# Patient Record
Sex: Female | Born: 1950 | Race: White | Hispanic: No | Marital: Married | Smoking: Former smoker
Health system: Southern US, Community
[De-identification: ages and names within clinical notes are randomized; demographics above are authoritative.]

## PROBLEM LIST (undated history)

## (undated) DIAGNOSIS — C4491 Basal cell carcinoma of skin, unspecified: Secondary | ICD-10-CM

## (undated) DIAGNOSIS — G43909 Migraine, unspecified, not intractable, without status migrainosus: Secondary | ICD-10-CM

## (undated) DIAGNOSIS — G4730 Sleep apnea, unspecified: Secondary | ICD-10-CM

## (undated) DIAGNOSIS — R112 Nausea with vomiting, unspecified: Secondary | ICD-10-CM

## (undated) DIAGNOSIS — M329 Systemic lupus erythematosus, unspecified: Secondary | ICD-10-CM

## (undated) DIAGNOSIS — Z9889 Other specified postprocedural states: Secondary | ICD-10-CM

## (undated) DIAGNOSIS — E119 Type 2 diabetes mellitus without complications: Secondary | ICD-10-CM

## (undated) DIAGNOSIS — F419 Anxiety disorder, unspecified: Secondary | ICD-10-CM

## (undated) DIAGNOSIS — T8859XA Other complications of anesthesia, initial encounter: Secondary | ICD-10-CM

## (undated) DIAGNOSIS — K589 Irritable bowel syndrome without diarrhea: Secondary | ICD-10-CM

## (undated) DIAGNOSIS — M069 Rheumatoid arthritis, unspecified: Secondary | ICD-10-CM

## (undated) DIAGNOSIS — G629 Polyneuropathy, unspecified: Secondary | ICD-10-CM

## (undated) DIAGNOSIS — I10 Essential (primary) hypertension: Secondary | ICD-10-CM

## (undated) DIAGNOSIS — IMO0002 Reserved for concepts with insufficient information to code with codable children: Secondary | ICD-10-CM

## (undated) DIAGNOSIS — I671 Cerebral aneurysm, nonruptured: Secondary | ICD-10-CM

## (undated) HISTORY — DX: Systemic lupus erythematosus, unspecified: M32.9

## (undated) HISTORY — DX: Irritable bowel syndrome, unspecified: K58.9

## (undated) HISTORY — PX: BIOPSY BREAST: PRO8

## (undated) HISTORY — PX: TENDON EXPLORATION: SHX5112

## (undated) HISTORY — DX: Migraine, unspecified, not intractable, without status migrainosus: G43.909

## (undated) HISTORY — DX: Cerebral aneurysm, nonruptured: I67.1

## (undated) HISTORY — PX: GALLBLADDER SURGERY: SHX652

## (undated) HISTORY — DX: Type 2 diabetes mellitus without complications: E11.9

## (undated) HISTORY — DX: Polyneuropathy, unspecified: G62.9

## (undated) HISTORY — DX: Basal cell carcinoma of skin, unspecified: C44.91

## (undated) HISTORY — PX: BLADDER SUSPENSION: SHX72

## (undated) HISTORY — DX: Reserved for concepts with insufficient information to code with codable children: IMO0002

---

## 2001-05-26 ENCOUNTER — Ambulatory Visit (HOSPITAL_BASED_OUTPATIENT_CLINIC_OR_DEPARTMENT_OTHER): Admission: RE | Admit: 2001-05-26 | Discharge: 2001-05-26 | Payer: Self-pay | Admitting: Family Medicine

## 2005-04-10 ENCOUNTER — Inpatient Hospital Stay (HOSPITAL_COMMUNITY): Admission: RE | Admit: 2005-04-10 | Discharge: 2005-04-13 | Payer: Self-pay | Admitting: Urology

## 2007-12-12 ENCOUNTER — Encounter: Admission: RE | Admit: 2007-12-12 | Discharge: 2007-12-12 | Payer: Self-pay | Admitting: Otolaryngology

## 2008-06-15 ENCOUNTER — Encounter: Admission: RE | Admit: 2008-06-15 | Discharge: 2008-06-15 | Payer: Self-pay | Admitting: Otolaryngology

## 2008-10-14 DIAGNOSIS — C4491 Basal cell carcinoma of skin, unspecified: Secondary | ICD-10-CM

## 2008-10-14 HISTORY — DX: Basal cell carcinoma of skin, unspecified: C44.91

## 2009-12-09 ENCOUNTER — Ambulatory Visit (HOSPITAL_BASED_OUTPATIENT_CLINIC_OR_DEPARTMENT_OTHER): Admission: RE | Admit: 2009-12-09 | Discharge: 2009-12-10 | Payer: Self-pay | Admitting: Urology

## 2010-07-04 ENCOUNTER — Encounter: Payer: Self-pay | Admitting: Otolaryngology

## 2010-08-28 LAB — GLUCOSE, CAPILLARY: Glucose-Capillary: 244 mg/dL — ABNORMAL HIGH (ref 70–99)

## 2010-08-28 LAB — PLATELET COUNT: Platelets: 168 10*3/uL (ref 150–400)

## 2010-08-28 LAB — POCT I-STAT 4, (NA,K, GLUC, HGB,HCT)
Glucose, Bld: 123 mg/dL — ABNORMAL HIGH (ref 70–99)
HCT: 37 % (ref 36.0–46.0)
Hemoglobin: 12.6 g/dL (ref 12.0–15.0)
Potassium: 3.9 mEq/L (ref 3.5–5.1)
Sodium: 141 mEq/L (ref 135–145)

## 2010-10-28 NOTE — Op Note (Signed)
[name], [name]                ACCOUNT NO.:  [id number]   MEDICAL RECORD NO.:  [id number]          PATIENT TYPE:  AMB   LOCATION:  DAY                          FACILITY:  [facility]   PHYSICIAN:  Sigmund I. Patsi Sears, M.D.DATE OF BIRTH:  August 10, 1950   DATE OF PROCEDURE:  04/10/2005  DATE OF DISCHARGE:                                 OPERATIVE REPORT   PREOPERATIVE DIAGNOSIS:  Pelvic prolapse with urinary incontinence.   POSTOPERATIVE DIAGNOSIS:  Pelvic prolapse with urinary incontinence.   OPERATION:  Pelvic prolapse repair with __________  bovine fascia and apical  suturing; anterior vaginal vault repair, Linx pubovaginal sling (mid  urethral), cystourethroscopy.   SURGEON:  Sigmund I. Patsi Sears, M.D.   ANESTHESIA:  General LMA.   PREOPERATIVE PREPARATION:  After appropriate preanesthesia, the patient was  brought to the operating room, placed on the operating room in the dorsal  supine position where general LMA anesthesia was induced. She was then  replaced in dorsal lithotomy position where pubis was prepped with Betadine  solution and draped in usual fashion.   REVIEW OF HISTORY:  This 60 year old female is status post hysterectomy in  the past, complains of stress urinary incontinence, and bladder pressure,  and urinary frequency. She has a history of antibiotic-induced colitis, and  SLE with migratory polyarthralgias, and a sed rate of 76. She has a history  of low platelets, currently has a platelet count of 96,000. She is now for  pelvic prolapse repair and incontinence repair.   PROCEDURE:  The large grade III cystocele was identified, and Foley catheter  was inserted in the  bladder. Ten cc of Marcaine 0.25 plain was injected in  the pubovaginal cervical arch tissue. A midline incision was then made  through a point proximal to the bladder neck, to the area of hysterectomy  incision. Subcutaneous tissue was sharply and bluntly dissected, all the way  to the pelvic  sidewall. This was accomplished with direct vision. Minimal  bleeding was noted during this dissection.   Using the Capio device, three separate #1 absorbable sutures were then  placed in the pelvic sidewall, and then a 4 x 7 centimeter portion of  __________  bovine fascia was then placed horizontally. This tissue fit  perfectly without any manipulation, and therefore each of the sutures was  placed through the fascia and the fascia was then sutured in place. The  cystocele repair was accomplished using 2-0 Vicryl suture, in classic Kelly  plication fashion, with care to not over extend the plication.   The fascia was then brought across the midline, and three separate Capio  sutures were then placed in the pelvic sidewall on the right side. The  sutures were then placed through the bovine fascia and sutured in place.  This gave excellent relief of the large central defect. A lateral defect was  identified, and this also repaired of the left lateral defect.   The edge of the vaginal tissue was trimmed approximately 2-4 mm on each  side, and then the edges were closed with a running the 2-0 Vicryl suture.   Cystoscopy  was accomplished after blue dye was given, and blue dye was  identified from the left side did not from the right side at this point.  Additional blue dye was given, and eventual repeat cystoscopy showed blue  dye from both orifices.   The mid urethra was identified, and a 0.25 plain Marcaine was injected in  the area of the mid urethra. An incision was made. Subcutaneous tissue was  then dissected bilaterally to either side of the urethra to the level of the  endopelvic fascia.   Suprapubically, midline was identified, and one fingerbreadth above the  pubis, two separate stab wounds were then made, subcutaneous tissue is  dissected with the Linx suprapubic dissector. This was passed  retropubically, brought out through the vaginal incision. Cystoscopy  revealed that  the left Linx probe was in the bladder.  This was removed,  replaced, repeat cystoscopy showed excellent placement with no bladder  trauma.   Foley catheter was replaced, and the [employer] Linx retropubic  suspension was then accomplished in the mid urethra. A large right-angle  clamp was used to tension the sling, and the plastic wrapper was removed.  After correct tensioning, the wings were then cut subcutaneously, and  following this, the urethral incision was closed in single layer with 2-0  Vicryl running suture. Skin was closed with 3-0 Vicryl suture, as well as  skin tapes. Foley catheter was left in place. Because of the patient's  history of low platelets, she was not given Toradol. Rather, she was  awakened and taken to the recovery room in good condition.      Sigmund I. Patsi Sears, M.D.  Electronically Signed     SIT/MEDQ  D:  04/10/2005  T:  04/10/2005  Job:  [id number]   cc:   Ruthy Dick  Fax: [phone]

## 2010-10-28 NOTE — Discharge Summary (Signed)
[name], [name]                ACCOUNT NO.:  [id number]   MEDICAL RECORD NO.:  [id number]          PATIENT TYPE:  INP   LOCATION:  1409                         FACILITY:  [facility]   PHYSICIAN:  Sigmund I. Patsi Sears, M.D.DATE OF BIRTH:  Mar 07, 1951   DATE OF ADMISSION:  04/10/2005  DATE OF DISCHARGE:  04/13/2005                                 DISCHARGE SUMMARY   FINAL DIAGNOSIS:  Pelvic prolapse.   OPERATION:  Took place on April 10, 2005.  The operation was  pelvicprolapse repair, cystocele repair, Lynxpubovaginal sling.   The patient's history is as follows.  [name] is a 60 year old married  female, seen on March 08, 2005 with suprapubic pressure, urinary  frequency, stress urinary incontinence.  On examination, she was found to  have a grade 3 cystourethrocele, and possible enterocele.  She had a  positive Marshall test, a positive Q-tip test in thesupine position.  She is  a para 2-2-0, status post hysterectomy in 2002.   PAST HISTORY:  1.  SLE with a sed rate of 76 (Dr. Kellie Simmering.)  2.  Antibiotic induced colitis.  3.  Migratory polyarthralgias.  4.  Liver hemangioma measuring 7.5 cm.  5.  History of low platelets (Platelets of 196,000 on day of surgery.)   PAST SURGERIES:  1.  Hysterectomy in 2002.  2.  Finger tendon surgery in 1999.  3.  Breast lumpectomy in 1978.  4.  Cholecystectomy.   TOBACCO:  A 30-pack year history, but none since 1992.   ALCOHOL:  Occasionally.   ALLERGIES:  KEFLEX.   MEDICATIONS:  1.  Wellbutrin 150 mg one p.o. per day for stress and anxiety.  2.  Lisinopril/Hydrochlorothiazide 20/25 mg one p.o. per day for      hypertension.  3.  Estrasorb.  4.  Tylenol.  5.  Ibuprofen.   FAMILY HISTORY:  Father died secondary to chronic lung disease.  Mother died  secondary to a cerebral hemorrhage at age 37.   REVIEW OF SYSTEMS:  Positive for headache, tired, sluggishness, abdominal  discomfort, occasional diarrhea, skin rash,  dyspareunia secondary to  dropped bladder, and urinary frequency.  The patient also complains of  cough/sneezing incontinence (See HPI,) and straining to have bowel movements  because something in the way.   PHYSICAL EXAMINATION:  As noted in dictated H&P of April 10, 2005.   ADMISSION LABORATORY DATA:  A platelet count of 191,000.  The admission EKG  is normal.  Hemoglobin is 10, hematocrit 32, potassium 3.5.   HOSPITAL COURSE:  On the day of admission, the patient underwent pelvic  floor repair and a Lynxrepair.  Lynx transvaginal sling with Dr. Patsi Sears  and Dr. McDiarmid.  The patient tolerated the procedure well.  She was  admitted to the hospital postoperatively for evaluation.  She had her  packing removed on the first postoperative day, and was allowed to be  discharged with the Foley catheter out on postop day two voiding independent  of catheter.  She tolerated her hospitalization well.  She was allowed to be  discharged on antibiotics, and pain medication.  She  will return to the  office for followup.  Appointment is made.      Sigmund I. Patsi Sears, M.D.  Electronically Signed     SIT/MEDQ  D:  06/29/2005  T:  06/29/2005  Job:  [id number]

## 2010-10-28 NOTE — H&P (Signed)
[name], [name]                ACCOUNT NO.:  [id number]   MEDICAL RECORD NO.:  [id number]          PATIENT TYPE:  AMB   LOCATION:  DAY                          [facility]ILITY:  [facility]   PHYSICIAN:  Sigmund I. Patsi Sears, M.D.DATE OF BIRTH:  1950-09-22   DATE OF ADMISSION:  04/10/2005  DATE OF DISCHARGE:                                HISTORY & PHYSICAL   HISTORY:  [name] is a 60 year old married female, seen 03/08/05,  complaining of the suprapubic bladder pressure, urinary frequency, stress  urinary continence. The patient was found on examination to have very grade  3 cystocele, possible enterocele as well. She had positive Marshall test,  positive Q-tip test in the supine position. She is para 2-2-0, and status  post hysterectomy in 2002.  Note, her past history is significant for  systemic lupus erythematosus, with a sed rate of 76 (Dr. Kellie Simmering), history  of antibiotic induced colitis, migratory polyarthralgias, liver hemangioma  measuring 7.5 cm, and a history of low platelets (currently 96,000, the day  of surgery was 191,000).   PAST SURGERIES:  1.  Hysterectomy 2002.  2.  Finger tendon surgery, 1999.  3.  Breast lumpectomy 1978.  4.  Cholecystectomy.   Gyn history: The patient is 2-2-0, and is status post hysterectomy.   Tobacco: 30 pack history but none since 1992.   Alcohol:  Occasional only.   ALLERGIES:  KEFLEX.   MEDICATIONS:  1.  Wellbutrin 150 milligrams one p.o. per day for stress and anxiety.  2.  Lisinopril/hydrochlorothiazide 20/25 milligrams one by mouth per day for      hypertension.  3.  Estrasorb.  4.  Tylenol.  5.  Ibuprofen.   FAMILY HISTORY:  Father deceased age 3 secondary to chronic lung disease,  and mother deceased at age 4 secondary to cerebral hemorrhage.   REVIEW OF SYSTEMS:  1.  Headache.  2.  Tiredness.  3.  Sluggishness.  4.  Abdominal discomfort.  5.  Occasional diarrhea.  6.  Skin rash.  7.  Painful intercourse or a dropped  bladder.  8.  Urinary frequency.  9.  Cough and sneeze incontinence.  10. Strain to have bowel movements because something in the way..   PHYSICAL EXAMINATION:  VITALS:  Her initial physical examination today shows  a well-developed white female in no acute distress. Her temperature is 96.7,  heart rate 99, respiratory rate 20, blood pressure 160/94.  HEENT: PERL.  EOM full.  NECK:  Supple, nontender, no nodes.  CHEST: Clear to P&A.  ABDOMEN:  Soft. Positive bowel sounds without organomegaly, without masses.  GENITOURINARY: The patient has normal external female genitalia. The  urethral meatus was normal in size and location.  There no masses. The  vagina has no discharge. The bladder is palpable, and [name] test was  positive in the supine position with a positive Q-tip test. There was a  urethrocele, grade 3 cystocele or possibly enterocele as well.  RECTAL:  Examination shows normal sphincter tone, no masses.  LYMPHATICS:  Nonpalpable.  SKIN:  Normal to inspection and palpation.  PSYCHOLOGIC:  Showed normal  orientation to time, person and place.   ADMITTING IMPRESSION:  Patient with a pelvic prolapse, large central defect,  in need of pelvic prolapse repair, cystocele repair, and pubovaginal sling.      Sigmund I. Patsi Sears, M.D.  Electronically Signed     SIT/MEDQ  D:  04/10/2005  T:  04/10/2005  Job:  [id number]

## 2011-02-16 ENCOUNTER — Encounter (INDEPENDENT_AMBULATORY_CARE_PROVIDER_SITE_OTHER): Payer: BC Managed Care – PPO | Admitting: Ophthalmology

## 2011-02-16 DIAGNOSIS — H4420 Degenerative myopia, unspecified eye: Secondary | ICD-10-CM

## 2011-02-16 DIAGNOSIS — H43819 Vitreous degeneration, unspecified eye: Secondary | ICD-10-CM

## 2011-02-16 DIAGNOSIS — H3530 Unspecified macular degeneration: Secondary | ICD-10-CM

## 2011-02-16 DIAGNOSIS — H2510 Age-related nuclear cataract, unspecified eye: Secondary | ICD-10-CM

## 2011-08-16 ENCOUNTER — Ambulatory Visit (INDEPENDENT_AMBULATORY_CARE_PROVIDER_SITE_OTHER): Payer: BC Managed Care – PPO | Admitting: Ophthalmology

## 2011-08-16 DIAGNOSIS — H43819 Vitreous degeneration, unspecified eye: Secondary | ICD-10-CM

## 2011-08-16 DIAGNOSIS — H3530 Unspecified macular degeneration: Secondary | ICD-10-CM

## 2011-08-16 DIAGNOSIS — H2510 Age-related nuclear cataract, unspecified eye: Secondary | ICD-10-CM

## 2011-08-16 DIAGNOSIS — H4420 Degenerative myopia, unspecified eye: Secondary | ICD-10-CM

## 2012-02-23 ENCOUNTER — Ambulatory Visit (INDEPENDENT_AMBULATORY_CARE_PROVIDER_SITE_OTHER): Payer: BC Managed Care – PPO | Admitting: Ophthalmology

## 2012-03-01 ENCOUNTER — Ambulatory Visit (INDEPENDENT_AMBULATORY_CARE_PROVIDER_SITE_OTHER): Payer: BC Managed Care – PPO | Admitting: Ophthalmology

## 2012-03-01 DIAGNOSIS — H3530 Unspecified macular degeneration: Secondary | ICD-10-CM

## 2012-03-01 DIAGNOSIS — I10 Essential (primary) hypertension: Secondary | ICD-10-CM

## 2012-03-01 DIAGNOSIS — H4420 Degenerative myopia, unspecified eye: Secondary | ICD-10-CM

## 2012-03-01 DIAGNOSIS — H43819 Vitreous degeneration, unspecified eye: Secondary | ICD-10-CM

## 2012-03-01 DIAGNOSIS — H35039 Hypertensive retinopathy, unspecified eye: Secondary | ICD-10-CM

## 2012-08-29 ENCOUNTER — Ambulatory Visit (INDEPENDENT_AMBULATORY_CARE_PROVIDER_SITE_OTHER): Payer: BC Managed Care – PPO | Admitting: Ophthalmology

## 2012-08-29 DIAGNOSIS — H35039 Hypertensive retinopathy, unspecified eye: Secondary | ICD-10-CM

## 2012-08-29 DIAGNOSIS — H43819 Vitreous degeneration, unspecified eye: Secondary | ICD-10-CM

## 2012-08-29 DIAGNOSIS — E1139 Type 2 diabetes mellitus with other diabetic ophthalmic complication: Secondary | ICD-10-CM

## 2012-08-29 DIAGNOSIS — I10 Essential (primary) hypertension: Secondary | ICD-10-CM

## 2012-08-29 DIAGNOSIS — H4420 Degenerative myopia, unspecified eye: Secondary | ICD-10-CM

## 2012-08-29 DIAGNOSIS — E11319 Type 2 diabetes mellitus with unspecified diabetic retinopathy without macular edema: Secondary | ICD-10-CM

## 2012-11-11 ENCOUNTER — Encounter (INDEPENDENT_AMBULATORY_CARE_PROVIDER_SITE_OTHER): Payer: BC Managed Care – PPO | Admitting: Ophthalmology

## 2012-11-11 DIAGNOSIS — H43819 Vitreous degeneration, unspecified eye: Secondary | ICD-10-CM

## 2012-11-11 DIAGNOSIS — H4420 Degenerative myopia, unspecified eye: Secondary | ICD-10-CM

## 2012-11-11 DIAGNOSIS — E1139 Type 2 diabetes mellitus with other diabetic ophthalmic complication: Secondary | ICD-10-CM

## 2012-11-11 DIAGNOSIS — H35039 Hypertensive retinopathy, unspecified eye: Secondary | ICD-10-CM

## 2012-11-11 DIAGNOSIS — E11319 Type 2 diabetes mellitus with unspecified diabetic retinopathy without macular edema: Secondary | ICD-10-CM

## 2012-11-11 DIAGNOSIS — I10 Essential (primary) hypertension: Secondary | ICD-10-CM

## 2012-11-19 DIAGNOSIS — D229 Melanocytic nevi, unspecified: Secondary | ICD-10-CM

## 2012-11-19 HISTORY — DX: Melanocytic nevi, unspecified: D22.9

## 2013-06-02 ENCOUNTER — Ambulatory Visit (INDEPENDENT_AMBULATORY_CARE_PROVIDER_SITE_OTHER): Payer: BC Managed Care – PPO | Admitting: Ophthalmology

## 2013-08-12 ENCOUNTER — Ambulatory Visit (INDEPENDENT_AMBULATORY_CARE_PROVIDER_SITE_OTHER): Payer: BC Managed Care – PPO | Admitting: Ophthalmology

## 2013-08-12 DIAGNOSIS — H4420 Degenerative myopia, unspecified eye: Secondary | ICD-10-CM

## 2013-08-12 DIAGNOSIS — H43819 Vitreous degeneration, unspecified eye: Secondary | ICD-10-CM

## 2013-08-12 DIAGNOSIS — E11319 Type 2 diabetes mellitus with unspecified diabetic retinopathy without macular edema: Secondary | ICD-10-CM

## 2013-08-12 DIAGNOSIS — I10 Essential (primary) hypertension: Secondary | ICD-10-CM

## 2013-08-12 DIAGNOSIS — E1139 Type 2 diabetes mellitus with other diabetic ophthalmic complication: Secondary | ICD-10-CM

## 2013-08-12 DIAGNOSIS — E1165 Type 2 diabetes mellitus with hyperglycemia: Secondary | ICD-10-CM

## 2013-08-12 DIAGNOSIS — H35039 Hypertensive retinopathy, unspecified eye: Secondary | ICD-10-CM

## 2013-09-17 DIAGNOSIS — C4491 Basal cell carcinoma of skin, unspecified: Secondary | ICD-10-CM

## 2013-09-17 HISTORY — DX: Basal cell carcinoma of skin, unspecified: C44.91

## 2014-08-13 ENCOUNTER — Ambulatory Visit (INDEPENDENT_AMBULATORY_CARE_PROVIDER_SITE_OTHER): Payer: BLUE CROSS/BLUE SHIELD | Admitting: Ophthalmology

## 2014-08-13 DIAGNOSIS — E11329 Type 2 diabetes mellitus with mild nonproliferative diabetic retinopathy without macular edema: Secondary | ICD-10-CM | POA: Diagnosis not present

## 2014-08-13 DIAGNOSIS — I10 Essential (primary) hypertension: Secondary | ICD-10-CM | POA: Diagnosis not present

## 2014-08-13 DIAGNOSIS — H35033 Hypertensive retinopathy, bilateral: Secondary | ICD-10-CM

## 2014-08-13 DIAGNOSIS — H43813 Vitreous degeneration, bilateral: Secondary | ICD-10-CM | POA: Diagnosis not present

## 2014-08-13 DIAGNOSIS — E11319 Type 2 diabetes mellitus with unspecified diabetic retinopathy without macular edema: Secondary | ICD-10-CM | POA: Diagnosis not present

## 2014-08-13 DIAGNOSIS — H4423 Degenerative myopia, bilateral: Secondary | ICD-10-CM

## 2015-08-17 ENCOUNTER — Ambulatory Visit (INDEPENDENT_AMBULATORY_CARE_PROVIDER_SITE_OTHER): Payer: BLUE CROSS/BLUE SHIELD | Admitting: Ophthalmology

## 2015-08-17 DIAGNOSIS — H43813 Vitreous degeneration, bilateral: Secondary | ICD-10-CM

## 2015-08-17 DIAGNOSIS — I10 Essential (primary) hypertension: Secondary | ICD-10-CM

## 2015-08-17 DIAGNOSIS — H4423 Degenerative myopia, bilateral: Secondary | ICD-10-CM

## 2015-08-17 DIAGNOSIS — H35033 Hypertensive retinopathy, bilateral: Secondary | ICD-10-CM

## 2015-10-27 DIAGNOSIS — C4492 Squamous cell carcinoma of skin, unspecified: Secondary | ICD-10-CM

## 2015-10-27 HISTORY — DX: Squamous cell carcinoma of skin, unspecified: C44.92

## 2016-08-16 ENCOUNTER — Ambulatory Visit (INDEPENDENT_AMBULATORY_CARE_PROVIDER_SITE_OTHER): Payer: BLUE CROSS/BLUE SHIELD | Admitting: Ophthalmology

## 2016-08-16 DIAGNOSIS — H4423 Degenerative myopia, bilateral: Secondary | ICD-10-CM | POA: Diagnosis not present

## 2016-08-16 DIAGNOSIS — H35033 Hypertensive retinopathy, bilateral: Secondary | ICD-10-CM | POA: Diagnosis not present

## 2016-08-16 DIAGNOSIS — H43813 Vitreous degeneration, bilateral: Secondary | ICD-10-CM | POA: Diagnosis not present

## 2016-08-16 DIAGNOSIS — I10 Essential (primary) hypertension: Secondary | ICD-10-CM

## 2016-12-19 DIAGNOSIS — R51 Headache: Secondary | ICD-10-CM | POA: Diagnosis not present

## 2016-12-19 DIAGNOSIS — Z6828 Body mass index (BMI) 28.0-28.9, adult: Secondary | ICD-10-CM | POA: Diagnosis not present

## 2016-12-19 DIAGNOSIS — H532 Diplopia: Secondary | ICD-10-CM | POA: Diagnosis not present

## 2017-01-02 DIAGNOSIS — E1165 Type 2 diabetes mellitus with hyperglycemia: Secondary | ICD-10-CM | POA: Diagnosis not present

## 2017-01-02 DIAGNOSIS — K210 Gastro-esophageal reflux disease with esophagitis: Secondary | ICD-10-CM | POA: Diagnosis not present

## 2017-01-02 DIAGNOSIS — E559 Vitamin D deficiency, unspecified: Secondary | ICD-10-CM | POA: Diagnosis not present

## 2017-01-02 DIAGNOSIS — E781 Pure hyperglyceridemia: Secondary | ICD-10-CM | POA: Diagnosis not present

## 2017-01-02 DIAGNOSIS — I10 Essential (primary) hypertension: Secondary | ICD-10-CM | POA: Diagnosis not present

## 2017-01-02 DIAGNOSIS — E1143 Type 2 diabetes mellitus with diabetic autonomic (poly)neuropathy: Secondary | ICD-10-CM | POA: Diagnosis not present

## 2017-01-02 DIAGNOSIS — F320 Major depressive disorder, single episode, mild: Secondary | ICD-10-CM | POA: Diagnosis not present

## 2017-01-05 DIAGNOSIS — K580 Irritable bowel syndrome with diarrhea: Secondary | ICD-10-CM | POA: Diagnosis not present

## 2017-01-05 DIAGNOSIS — E559 Vitamin D deficiency, unspecified: Secondary | ICD-10-CM | POA: Diagnosis not present

## 2017-01-05 DIAGNOSIS — Z6827 Body mass index (BMI) 27.0-27.9, adult: Secondary | ICD-10-CM | POA: Diagnosis not present

## 2017-01-05 DIAGNOSIS — E1165 Type 2 diabetes mellitus with hyperglycemia: Secondary | ICD-10-CM | POA: Diagnosis not present

## 2017-01-05 DIAGNOSIS — I10 Essential (primary) hypertension: Secondary | ICD-10-CM | POA: Diagnosis not present

## 2017-01-05 DIAGNOSIS — M3210 Systemic lupus erythematosus, organ or system involvement unspecified: Secondary | ICD-10-CM | POA: Diagnosis not present

## 2017-01-05 DIAGNOSIS — E1143 Type 2 diabetes mellitus with diabetic autonomic (poly)neuropathy: Secondary | ICD-10-CM | POA: Diagnosis not present

## 2017-01-05 DIAGNOSIS — G619 Inflammatory polyneuropathy, unspecified: Secondary | ICD-10-CM | POA: Diagnosis not present

## 2017-01-17 DIAGNOSIS — Z23 Encounter for immunization: Secondary | ICD-10-CM | POA: Diagnosis not present

## 2017-01-24 ENCOUNTER — Ambulatory Visit (INDEPENDENT_AMBULATORY_CARE_PROVIDER_SITE_OTHER): Payer: Medicare Other | Admitting: Neurology

## 2017-01-24 ENCOUNTER — Encounter: Payer: Self-pay | Admitting: Neurology

## 2017-01-24 VITALS — BP 171/94 | HR 79 | Ht 66.0 in | Wt 176.0 lb

## 2017-01-24 DIAGNOSIS — R5382 Chronic fatigue, unspecified: Secondary | ICD-10-CM | POA: Diagnosis not present

## 2017-01-24 DIAGNOSIS — H532 Diplopia: Secondary | ICD-10-CM | POA: Diagnosis not present

## 2017-01-24 DIAGNOSIS — G4489 Other headache syndrome: Secondary | ICD-10-CM

## 2017-01-24 DIAGNOSIS — E538 Deficiency of other specified B group vitamins: Secondary | ICD-10-CM | POA: Diagnosis not present

## 2017-01-24 MED ORDER — TOPIRAMATE 25 MG PO TABS: ORAL_TABLET | ORAL | 3 refills | Status: DC

## 2017-01-24 NOTE — Progress Notes (Signed)
Reason for visit: Headache, double vision  Referring physician: Dr. Howie Ill is a 66 y.o. female  History of present illness:  [name] is a 66 year old right-handed white female with a history of diabetes and a history of lupus. The patient retired in June 2018, and around that time she began noting onset of headaches that were occurring 3 or 4 times a week. The headaches are in the frontal areas and on the top of the head, they may be associated with nausea without vomiting. She denies significant issues with photophobia or phonophobia. She did not have headaches earlier in her life, this is a new issue for her. The patient has also begun to note episodes of double vision that are occurring on average twice a week, may last for several hours and then clear. The double vision is primarily horizontal in nature, and does not always correlate with the headaches. The patient reports no other numbness or weakness on the face, arms, or legs. She does have some tingling in the toes that she attributes to a peripheral neuropathy associated with the diabetes. She has also had 3 events of hard shaking chills, possibly associated with fever. The patient has not had any weight loss. She does have a history of macular degeneration that is present on both sides, worse on the left. She notes that during the periods of double vision when she closes one eye the double vision will go away. The patient is taking Tylenol for the headache, this helped initially but now is ineffective, she is taking Excedrin Migraine at this time. She has not had a change in balance or a change in control of the bowels or the bladder. She is sent to this office for further evaluation. She denies any droopiness of the eyelids. The patient denies any difficulty with slurred speech or difficulty swallowing.  Past Medical History:  Diagnosis Date  . Basal cell carcinoma   . Diabetes ([place])   . IBS (irritable bowel  syndrome)   . Lupus   . Neuropathy     Past Surgical History:  Procedure Laterality Date  . BIOPSY BREAST Right    negative  . GALLBLADDER SURGERY    . TENDON EXPLORATION     arms/hand    Family History  Problem Relation Age of Onset  . Cerebral aneurysm Mother   . High blood pressure Mother   . Thyroid disease Mother   . COPD Father     Social history:  reports that she has quit smoking. She has never used smokeless tobacco. She reports that she does not drink alcohol or use drugs.  Medications:  Prior to Admission medications   Medication Sig Start Date End Date Taking? Authorizing Provider  amoxicillin (AMOXIL) 500 MG capsule TAKE THREE CAPSULES BY MOUTH TWICE DAILY 01/10/17  Yes [provider]  aspirin-acetaminophen-caffeine (EXCEDRIN MIGRAINE) 250-250-65 MG tablet Take 1 tablet by mouth every 6 (six) hours as needed for headache.   Yes [provider]  Cholecalciferol (VITAMIN D PO) Take 2,000 Units by mouth daily. With calcium   Yes [provider]  diphenhydramine-acetaminophen (TYLENOL PM) 25-500 MG TABS tablet Take 1 tablet by mouth at bedtime as needed.   Yes [provider]  FLUoxetine (PROZAC) 20 MG capsule Take 20 mg by mouth daily. 01/23/17  Yes [provider]  lisinopril-hydrochlorothiazide (PRINZIDE,ZESTORETIC) 10-12.5 MG tablet Take 1 tablet by mouth daily.   Yes [provider]  pioglitazone (ACTOS) 15 MG  tablet Take 15 mg by mouth daily. 01/05/17  Yes [provider]  [facility] injection  01/16/17  Yes [provider]  zolpidem (AMBIEN) 10 MG tablet Take 10 mg by mouth at bedtime as needed for sleep.   Yes [provider]     Not on File  ROS:  Out of a complete 14 system review of symptoms, the patient complains only of the following symptoms, and all other reviewed systems are negative.  Fevers, chills, fatigue Swelling in the legs Dizziness Blurred vision, double vision,  eye pain Snoring Diarrhea Feeling hot, cold, flushing Joint pain, achy muscles Confusion, headache, dizziness  Blood pressure (!) 171/94, pulse 79, height 5\' 6"  (1.676 m), weight 176 lb (79.8 kg).  Physical Exam  General: The patient is alert and cooperative at the time of the examination.  Eyes: Pupils are equal, round, and reactive to light. Discs are flat bilaterally.  Neck: The neck is supple, no carotid bruits are noted.  Respiratory: The respiratory examination is clear.  Cardiovascular: The cardiovascular examination reveals a regular rate and rhythm, no obvious murmurs or rubs are noted.  Skin: Extremities are without significant edema.  Neurologic Exam  Mental status: The patient is alert and oriented x 3 at the time of the examination. The patient has apparent normal recent and remote memory, with an apparently normal attention span and concentration ability.  Cranial nerves: Facial symmetry is present. There is good sensation of the face to pinprick and soft touch bilaterally. The strength of the facial muscles and the muscles to head turning and shoulder shrug are normal bilaterally. Speech is well enunciated, no aphasia or dysarthria is noted. Extraocular movements are full. Visual fields are full. The tongue is midline, and the patient has symmetric elevation of the soft palate. No obvious hearing deficits are noted.  Motor: The motor testing reveals 5 over 5 strength of all 4 extremities. Good symmetric motor tone is noted throughout.  Sensory: Sensory testing is intact to pinprick, soft touch, vibration sensation, and position sense on all 4 extremities, with exception of a stocking pattern pinprick sensory deficit in the distal one half of the lower extremity is bilaterally. No evidence of extinction is noted.  Coordination: Cerebellar testing reveals good finger-nose-finger and heel-to-shin bilaterally.  Gait and station: Gait is normal. Tandem gait is slightly  unsteady. Romberg is negative. No drift is seen.  Reflexes: Deep tendon reflexes are symmetric, but are somewhat brisk bilaterally. The patient has 4-5 beats of ankle clonus on the right, not present on the left. Toes are downgoing bilaterally.   Assessment/Plan:  1. Headache, new onset  2. Double vision, episodic  3. Diabetes  4. Lupus  The patient has had onset of headache, intermittent double vision, she has also had several episodes of shaking chills and fever. These events have occurred over the last 2 months. The patient will be sent for blood work today, she will have MRI of the brain. MRA of the head will be set up. She will be given a trial on Topamax for the headache, she will follow-up in 2 months. She will call for any dose adjustments of the medication.   Jill Alexanders MD 01/24/2017 12:04 PM  [facility] [address] [place], [place] [address]  Phone [phone] Fax [phone]

## 2017-01-24 NOTE — Patient Instructions (Addendum)
   We will check MRI of the brain and get blood work.   WE will start Topamax for the headache.  Topamax (topiramate) is a seizure medication that has an FDA approval for seizures and for migraine headache. Potential side effects of this medication include weight loss, cognitive slowing, tingling in the fingers and toes, and carbonated drinks will taste bad. If any significant side effects are noted on this drug, please contact our office.

## 2017-01-27 LAB — ENA+DNA/DS+SJORGEN'S
ENA RNP Ab: 6.8 AI — ABNORMAL HIGH (ref 0.0–0.9)
ENA SM Ab Ser-aCnc: <0.2 AI (ref 0.0–0.9)
ENA SSA (RO) Ab: >8 AI — ABNORMAL HIGH (ref 0.0–0.9)
dsDNA Ab: 9 IU/mL (ref 0–9)

## 2017-01-27 LAB — VITAMIN B12: Vitamin B-12: 241 pg/mL (ref 232–1245)

## 2017-01-27 LAB — RHEUMATOID FACTOR: Rheumatoid fact SerPl-aCnc: 16.4 IU/mL — ABNORMAL HIGH (ref 0.0–13.9)

## 2017-01-27 LAB — PAN-ANCA
ANCA Proteinase 3: <3.5 U/mL (ref 0.0–3.5)
Atypical pANCA: <1:20 {titer}
C-ANCA: <1:20 {titer}

## 2017-01-27 LAB — B. BURGDORFI ANTIBODIES: Lyme IgG/IgM Ab: <0.91 {ISR} (ref 0.00–0.90)

## 2017-01-27 LAB — ANA W/REFLEX: Anti Nuclear Antibody(ANA): POSITIVE — AB

## 2017-01-27 LAB — ANGIOTENSIN CONVERTING ENZYME: Angio Convert Enzyme: <15 U/L (ref 14–82)

## 2017-01-27 LAB — SEDIMENTATION RATE: Sed Rate: 46 mm/hr — ABNORMAL HIGH (ref 0–40)

## 2017-01-27 LAB — TSH: TSH: 0.916 u[IU]/mL (ref 0.450–4.500)

## 2017-01-27 LAB — ACETYLCHOLINE RECEPTOR, BINDING: AChR Binding Ab, Serum: 0.1 nmol/L (ref 0.00–0.24)

## 2017-01-28 ENCOUNTER — Telehealth: Payer: Self-pay | Admitting: Neurology

## 2017-01-28 NOTE — Telephone Encounter (Signed)
I called patient. The blood work reveals a positive rheumatoid factor in low titer, positive ANA with multiple positive antibodies on the panel, and a slightly elevated sedimentation rate. The patient does have lupus, this could be a potential source of some of her symptoms.  The vitamin B12 level was in the low normal range, she is to go on vitamin B-12 supplementation, taking 1000 g daily.  MRI the brain and MRA of the head is pending.

## 2017-01-28 NOTE — Telephone Encounter (Signed)
This patient needs Xanax for her MRI study.

## 2017-01-29 MED ORDER — ALPRAZOLAM 0.5 MG PO TABS: ORAL_TABLET | ORAL | 0 refills | Status: DC

## 2017-01-29 NOTE — Telephone Encounter (Signed)
Faxed printed/signed rx xanax to patient pharmacy at [phone]. Received confirmation.

## 2017-02-04 ENCOUNTER — Ambulatory Visit
Admission: RE | Admit: 2017-02-04 | Discharge: 2017-02-04 | Disposition: A | Discharge status: Home / Self Care | Payer: Medicare Other | Source: Ambulatory Visit | Attending: Neurology | Admitting: Neurology

## 2017-02-04 DIAGNOSIS — R51 Headache: Secondary | ICD-10-CM | POA: Diagnosis not present

## 2017-02-04 DIAGNOSIS — G4489 Other headache syndrome: Secondary | ICD-10-CM | POA: Diagnosis not present

## 2017-02-04 DIAGNOSIS — H532 Diplopia: Secondary | ICD-10-CM

## 2017-02-04 MED ORDER — GADOBENATE DIMEGLUMINE 529 MG/ML IV SOLN
15.0000 mL | Freq: Once | INTRAVENOUS | Status: AC | PRN
  Administered 2017-02-04: 15 mL via INTRAVENOUS

## 2017-02-06 ENCOUNTER — Telehealth: Payer: Self-pay | Admitting: Neurology

## 2017-02-06 DIAGNOSIS — M3219 Other organ or system involvement in systemic lupus erythematosus: Secondary | ICD-10-CM

## 2017-02-06 MED ORDER — PYRIDOSTIGMINE BROMIDE 60 MG PO TABS: 30.0000 mg | ORAL_TABLET | Freq: Three times a day (TID) | ORAL | 2 refills | Status: DC

## 2017-02-06 NOTE — Telephone Encounter (Signed)
I called patient. The Topamax has helped the headaches some, she is now 50 mg at night she will go to 75. She is still having double vision, this has worsened, she can close either eye and the double vision goes away.  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  I will give the patient an empiric trial on Mestinon taking 30 mg 3 times daily.  I will make a referral for a second opinion through a rheumatologist. 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   MRI brain 02/05/17:  IMPRESSION:  This MRI of the brain with and without contrast shows the following: 1.   Cortical atrophy, essentially unchanged compared to the 06/15/2008 MRI. 2.   A few scattered T2/FLAIR hyperintense foci consistent with mild age-related chronic microvascular ischemic change. None of these appear to be acute.   3.   Heterogenous appearance to the parotid glands is is nonspecific but could be due to Sjogren's syndrome or other autoimmune disorder.   This was also noted on the 2010 MRI.   MRA head 02/05/17:  IMPRESSION:  This MR angiogram of the intracranial arteries shows the following: 1.    Small 2.5 mm outpouching of the left supraclinoid internal carotid artery. This could represent a small aneurysm or an enlarged arterial infundibulum. 2.    Mild left vertebral artery stenosis that does not appear to be hemodynamically significant.

## 2017-02-20 DIAGNOSIS — Z961 Presence of intraocular lens: Secondary | ICD-10-CM | POA: Diagnosis not present

## 2017-02-22 DIAGNOSIS — R5383 Other fatigue: Secondary | ICD-10-CM | POA: Diagnosis not present

## 2017-02-22 DIAGNOSIS — R768 Other specified abnormal immunological findings in serum: Secondary | ICD-10-CM | POA: Diagnosis not present

## 2017-02-22 DIAGNOSIS — M2550 Pain in unspecified joint: Secondary | ICD-10-CM | POA: Diagnosis not present

## 2017-02-22 DIAGNOSIS — R51 Headache: Secondary | ICD-10-CM | POA: Diagnosis not present

## 2017-02-22 DIAGNOSIS — D696 Thrombocytopenia, unspecified: Secondary | ICD-10-CM | POA: Diagnosis not present

## 2017-02-22 DIAGNOSIS — E663 Overweight: Secondary | ICD-10-CM | POA: Diagnosis not present

## 2017-02-22 DIAGNOSIS — Z6827 Body mass index (BMI) 27.0-27.9, adult: Secondary | ICD-10-CM | POA: Diagnosis not present

## 2017-02-22 DIAGNOSIS — E119 Type 2 diabetes mellitus without complications: Secondary | ICD-10-CM | POA: Diagnosis not present

## 2017-02-22 DIAGNOSIS — Z8739 Personal history of other diseases of the musculoskeletal system and connective tissue: Secondary | ICD-10-CM | POA: Diagnosis not present

## 2017-02-22 DIAGNOSIS — M3500 Sicca syndrome, unspecified: Secondary | ICD-10-CM | POA: Diagnosis not present

## 2017-03-05 ENCOUNTER — Telehealth: Payer: Self-pay | Admitting: Neurology

## 2017-03-05 NOTE — Telephone Encounter (Signed)
The patient was seen by Dr. Trudie Reed, she was felt to have Sjogren's syndrome, but possibly lupus as well. Further evaluation is underway.

## 2017-03-27 ENCOUNTER — Ambulatory Visit (INDEPENDENT_AMBULATORY_CARE_PROVIDER_SITE_OTHER): Payer: Medicare Other | Admitting: Neurology

## 2017-03-27 ENCOUNTER — Encounter: Payer: Self-pay | Admitting: Neurology

## 2017-03-27 VITALS — BP 133/86 | HR 81 | Ht 66.0 in | Wt 173.0 lb

## 2017-03-27 DIAGNOSIS — H532 Diplopia: Secondary | ICD-10-CM | POA: Diagnosis not present

## 2017-03-27 DIAGNOSIS — G4489 Other headache syndrome: Secondary | ICD-10-CM

## 2017-03-27 NOTE — Patient Instructions (Signed)
   We will check blood work today.

## 2017-03-27 NOTE — Progress Notes (Signed)
Reason for visit: Double vision, headache  [name] is an 66 y.o. female  History of present illness:  [name] is a 66 year old right-handed white female with a history of double vision that is horizontal in nature. She will have double vision when she looks to the left or to the right or straight ahead. The patient will have good days and bad days, sometimes she has no double vision whatsoever. She oftentimes has worse trouble with vision in the morning, better later in the day. The patient reports some problems with headaches, she has been placed on Topamax with good benefit. The patient will have brief episodes of pain that are migratory occurring 2 or 3 times a week lasting about 10 or 15 minutes. The patient so far is tolerating the Topamax fairly well, she does have some stomach upset. She was given a trial on Mestinon, but she could not tolerate this medication. Blood work has shown evidence of a positive ANA with positive SSA and SSB antibodies, and a positive rheumatoid factor. The patient has been seen by Dr. Trudie Reed, it is felt that she may have Sjogren's syndrome. No treatment was offered. The sedimentation rate was slightly elevated. The patient had a low normal B12 level, she is now on B12 supplementation. MRI of the brain showed very minimal white matter changes, but nothing to explain the double vision. 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 The patient returns to office today for an evaluation. She denies any problems with ptosis, she denies problems with speech or swallowing. She has no weakness of the extremities, she denies any numbness of the extremities.  Past Medical History:  Diagnosis Date  . Basal cell carcinoma   . Diabetes ([place])   . IBS (irritable bowel syndrome)   . Lupus   . Neuropathy     Past Surgical History:  Procedure Laterality Date  . BIOPSY BREAST Right    negative  . GALLBLADDER SURGERY    . TENDON  EXPLORATION     arms/hand    Family History  Problem Relation Age of Onset  . Cerebral aneurysm Mother   . High blood pressure Mother   . Thyroid disease Mother   . COPD Father     Social history:  reports that she has quit smoking. She has never used smokeless tobacco. She reports that she does not drink alcohol or use drugs.   No Known Allergies  Medications:  Prior to Admission medications   Medication Sig Start Date End Date Taking? Authorizing Provider  aspirin-acetaminophen-caffeine (EXCEDRIN MIGRAINE) [phone] MG tablet Take 1 tablet by mouth every 6 (six) hours as needed for headache.   Yes [provider]  Cholecalciferol (VITAMIN D PO) Take 2,000 Units by mouth daily. With calcium   Yes [provider]  diphenhydramine-acetaminophen (TYLENOL PM) 25-500 MG TABS tablet Take 1 tablet by mouth at bedtime as needed.   Yes [provider]  FLUoxetine (PROZAC) 20 MG capsule Take 20 mg by mouth daily. 01/23/17  Yes [provider]  lisinopril-hydrochlorothiazide (PRINZIDE,ZESTORETIC) 10-12.5 MG tablet Take 1 tablet by mouth daily.   Yes [provider]  pioglitazone (ACTOS) 15 MG tablet Take 15 mg by mouth daily. 01/05/17  Yes [provider]  [facility] injection Inject 0.5 mLs into the muscle once.  01/16/17  Yes [provider]  topiramate (TOPAMAX) 25 MG tablet Take one tablet at night for one week, then take 2  tablets at night for one week, then take 3 tablets at night. 01/24/17  Yes Kathrynn Ducking, MD  zolpidem (AMBIEN) 10 MG tablet Take 10 mg by mouth at bedtime as needed for sleep.   Yes [provider]    ROS:  Out of a complete 14 system review of symptoms, the patient complains only of the following symptoms, and all other reviewed systems are negative.  Double vision  Blood pressure 133/86, pulse 81, height 5\' 6"  (1.676 m), weight 173 lb (78.5 kg).  Physical Exam  General: The patient is alert  and cooperative at the time of the examination.  Skin: No significant peripheral edema is noted.   Neurologic Exam  Mental status: The patient is alert and oriented x 3 at the time of the examination. The patient has apparent normal recent and remote memory, with an apparently normal attention span and concentration ability.   Cranial nerves: Facial symmetry is present. Speech is normal, no aphasia or dysarthria is noted. Extraocular movements are full. Visual fields are full. Cover test is positive in the horizontal plane.  Motor: The patient has good strength in all 4 extremities.  Sensory examination: Soft touch sensation is symmetric on the face, arms, and legs.  Coordination: The patient has good finger-nose-finger and heel-to-shin bilaterally.  Gait and station: The patient has a normal gait. Tandem gait is normal. Romberg is negative. No drift is seen.  Reflexes: Deep tendon reflexes are symmetric.    MRI brain 02/05/17:  IMPRESSION: This MRI of the brain with and without contrast shows the following: 1. Cortical atrophy, essentially unchanged compared to the 06/15/2008 MRI. 2. A few scattered T2/FLAIR hyperintense foci consistent with mild age-related chronic microvascular ischemic change. None of these appear to be acute.  3. Heterogenous appearance to the parotid glands is nonspecific but could be due to Sjogren's syndrome or other autoimmune disorder. This was also noted on the 2010 MRI.  * MRI scan images were reviewed online. I agree with the written report.   Assessment/Plan:  1. Double vision  2. Headache  3. 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 The patient has developed double vision, it is not clear whether the underlying autoimmune disease has anything to do with the current symptoms. The patient will be  sent for further blood work, if this is unremarkable, I may consider a referral to Dr. Sanda Klein at [facility]. The patient will otherwise follow-up in 4 or 5 months. The headaches have significantly improved on Topamax, we will continue this medication for now.  Jill Alexanders MD 03/27/2017 9:53 AM  [facility] [address] [place], [place] [address]  Phone [phone] Fax [phone]

## 2017-04-01 LAB — VITAMIN B1: Thiamine: 114.2 nmol/L (ref 66.5–200.0)

## 2017-04-01 LAB — THYROGLOBULIN ANTIBODY: THYROGLOBULIN ANTIBODY: 2.2 [IU]/mL — AB (ref 0.0–0.9)

## 2017-04-01 LAB — THYROID PEROXIDASE ANTIBODY: Thyroperoxidase Ab SerPl-aCnc: 11 [IU]/mL (ref 0–34)

## 2017-04-01 LAB — ACETYLCHOLINE RECEPTOR, MODULATING

## 2017-04-01 LAB — ACETYLCHOLINE RECEPTOR, BLOCKING: Acetylchol Block Ab: 12 % (ref 0–25)

## 2017-04-03 ENCOUNTER — Telehealth: Payer: Self-pay | Admitting: Neurology

## 2017-04-03 NOTE — Telephone Encounter (Signed)
I called patient. The blood work essentially was unremarkable with exception that there was a minimal elevation of the thyroglobulin antibody. Not clear that this is of any clinical significance.  We have discussed the possible referral to Dr. Sanda Klein at [facility], if she desires to have an opinion through him I will be happy to set this up.

## 2017-04-04 DIAGNOSIS — M2550 Pain in unspecified joint: Secondary | ICD-10-CM | POA: Diagnosis not present

## 2017-04-04 DIAGNOSIS — R5383 Other fatigue: Secondary | ICD-10-CM | POA: Diagnosis not present

## 2017-04-04 DIAGNOSIS — R768 Other specified abnormal immunological findings in serum: Secondary | ICD-10-CM | POA: Diagnosis not present

## 2017-04-04 DIAGNOSIS — R51 Headache: Secondary | ICD-10-CM | POA: Diagnosis not present

## 2017-04-04 DIAGNOSIS — D696 Thrombocytopenia, unspecified: Secondary | ICD-10-CM | POA: Diagnosis not present

## 2017-04-04 DIAGNOSIS — E119 Type 2 diabetes mellitus without complications: Secondary | ICD-10-CM | POA: Diagnosis not present

## 2017-04-04 DIAGNOSIS — M3500 Sicca syndrome, unspecified: Secondary | ICD-10-CM | POA: Diagnosis not present

## 2017-04-04 DIAGNOSIS — Z8739 Personal history of other diseases of the musculoskeletal system and connective tissue: Secondary | ICD-10-CM | POA: Diagnosis not present

## 2017-04-04 DIAGNOSIS — E663 Overweight: Secondary | ICD-10-CM | POA: Diagnosis not present

## 2017-04-04 DIAGNOSIS — Z6827 Body mass index (BMI) 27.0-27.9, adult: Secondary | ICD-10-CM | POA: Diagnosis not present

## 2017-04-05 ENCOUNTER — Telehealth: Payer: Self-pay

## 2017-04-05 ENCOUNTER — Telehealth: Payer: Self-pay | Admitting: Neurology

## 2017-04-05 NOTE — Telephone Encounter (Signed)
[name] from the office of Dr. Trudie Reed contacted me.  They do believe that she has Sjogren's syndrome/lupus.  This could potentially be associated with a double vision.  They will initiate treatment with prednisone 50 mg daily, depending upon her response, they may taper off of this fairly rapidly.  I would agree with the treatment plan.

## 2017-04-05 NOTE — Telephone Encounter (Signed)
Please refer to telephone note from today's date, I talked with Harrel Carina concerning this patient.

## 2017-04-05 NOTE — Telephone Encounter (Signed)
Marella Chimes, PA for Dr. Berna Bue was requesting to speak with Dr. Jannifer Franklin about this patient. She can be reached at [phone].

## 2017-04-09 DIAGNOSIS — E1169 Type 2 diabetes mellitus with other specified complication: Secondary | ICD-10-CM | POA: Diagnosis not present

## 2017-04-09 DIAGNOSIS — E559 Vitamin D deficiency, unspecified: Secondary | ICD-10-CM | POA: Diagnosis not present

## 2017-04-09 DIAGNOSIS — K210 Gastro-esophageal reflux disease with esophagitis: Secondary | ICD-10-CM | POA: Diagnosis not present

## 2017-04-09 DIAGNOSIS — E781 Pure hyperglyceridemia: Secondary | ICD-10-CM | POA: Diagnosis not present

## 2017-04-09 DIAGNOSIS — F320 Major depressive disorder, single episode, mild: Secondary | ICD-10-CM | POA: Diagnosis not present

## 2017-04-09 DIAGNOSIS — I10 Essential (primary) hypertension: Secondary | ICD-10-CM | POA: Diagnosis not present

## 2017-04-09 DIAGNOSIS — E1143 Type 2 diabetes mellitus with diabetic autonomic (poly)neuropathy: Secondary | ICD-10-CM | POA: Diagnosis not present

## 2017-04-09 DIAGNOSIS — E1165 Type 2 diabetes mellitus with hyperglycemia: Secondary | ICD-10-CM | POA: Diagnosis not present

## 2017-04-13 DIAGNOSIS — E559 Vitamin D deficiency, unspecified: Secondary | ICD-10-CM | POA: Diagnosis not present

## 2017-04-13 DIAGNOSIS — E1143 Type 2 diabetes mellitus with diabetic autonomic (poly)neuropathy: Secondary | ICD-10-CM | POA: Diagnosis not present

## 2017-04-13 DIAGNOSIS — I10 Essential (primary) hypertension: Secondary | ICD-10-CM | POA: Diagnosis not present

## 2017-04-13 DIAGNOSIS — Z1389 Encounter for screening for other disorder: Secondary | ICD-10-CM | POA: Diagnosis not present

## 2017-04-13 DIAGNOSIS — Z23 Encounter for immunization: Secondary | ICD-10-CM | POA: Diagnosis not present

## 2017-04-13 DIAGNOSIS — Z6827 Body mass index (BMI) 27.0-27.9, adult: Secondary | ICD-10-CM | POA: Diagnosis not present

## 2017-04-13 DIAGNOSIS — E1165 Type 2 diabetes mellitus with hyperglycemia: Secondary | ICD-10-CM | POA: Diagnosis not present

## 2017-04-13 DIAGNOSIS — G619 Inflammatory polyneuropathy, unspecified: Secondary | ICD-10-CM | POA: Diagnosis not present

## 2017-05-07 DIAGNOSIS — Z6827 Body mass index (BMI) 27.0-27.9, adult: Secondary | ICD-10-CM | POA: Diagnosis not present

## 2017-05-07 DIAGNOSIS — R51 Headache: Secondary | ICD-10-CM | POA: Diagnosis not present

## 2017-05-07 DIAGNOSIS — E663 Overweight: Secondary | ICD-10-CM | POA: Diagnosis not present

## 2017-05-07 DIAGNOSIS — M3500 Sicca syndrome, unspecified: Secondary | ICD-10-CM | POA: Diagnosis not present

## 2017-05-07 DIAGNOSIS — M2550 Pain in unspecified joint: Secondary | ICD-10-CM | POA: Diagnosis not present

## 2017-05-07 DIAGNOSIS — D696 Thrombocytopenia, unspecified: Secondary | ICD-10-CM | POA: Diagnosis not present

## 2017-05-07 DIAGNOSIS — Z8739 Personal history of other diseases of the musculoskeletal system and connective tissue: Secondary | ICD-10-CM | POA: Diagnosis not present

## 2017-05-07 DIAGNOSIS — E119 Type 2 diabetes mellitus without complications: Secondary | ICD-10-CM | POA: Diagnosis not present

## 2017-05-07 DIAGNOSIS — R5383 Other fatigue: Secondary | ICD-10-CM | POA: Diagnosis not present

## 2017-05-07 DIAGNOSIS — R768 Other specified abnormal immunological findings in serum: Secondary | ICD-10-CM | POA: Diagnosis not present

## 2017-05-14 DIAGNOSIS — A084 Viral intestinal infection, unspecified: Secondary | ICD-10-CM | POA: Diagnosis not present

## 2017-05-14 DIAGNOSIS — Z6827 Body mass index (BMI) 27.0-27.9, adult: Secondary | ICD-10-CM | POA: Diagnosis not present

## 2017-05-14 DIAGNOSIS — N3000 Acute cystitis without hematuria: Secondary | ICD-10-CM | POA: Diagnosis not present

## 2017-05-18 ENCOUNTER — Other Ambulatory Visit: Payer: Self-pay | Admitting: Neurology

## 2017-07-20 DIAGNOSIS — N3000 Acute cystitis without hematuria: Secondary | ICD-10-CM | POA: Diagnosis not present

## 2017-07-20 DIAGNOSIS — R300 Dysuria: Secondary | ICD-10-CM | POA: Diagnosis not present

## 2017-07-20 DIAGNOSIS — Z6828 Body mass index (BMI) 28.0-28.9, adult: Secondary | ICD-10-CM | POA: Diagnosis not present

## 2017-08-09 DIAGNOSIS — K210 Gastro-esophageal reflux disease with esophagitis: Secondary | ICD-10-CM | POA: Diagnosis not present

## 2017-08-09 DIAGNOSIS — E781 Pure hyperglyceridemia: Secondary | ICD-10-CM | POA: Diagnosis not present

## 2017-08-09 DIAGNOSIS — R5383 Other fatigue: Secondary | ICD-10-CM | POA: Diagnosis not present

## 2017-08-09 DIAGNOSIS — E1143 Type 2 diabetes mellitus with diabetic autonomic (poly)neuropathy: Secondary | ICD-10-CM | POA: Diagnosis not present

## 2017-08-09 DIAGNOSIS — K580 Irritable bowel syndrome with diarrhea: Secondary | ICD-10-CM | POA: Diagnosis not present

## 2017-08-09 DIAGNOSIS — E1165 Type 2 diabetes mellitus with hyperglycemia: Secondary | ICD-10-CM | POA: Diagnosis not present

## 2017-08-09 DIAGNOSIS — F320 Major depressive disorder, single episode, mild: Secondary | ICD-10-CM | POA: Diagnosis not present

## 2017-08-09 DIAGNOSIS — I10 Essential (primary) hypertension: Secondary | ICD-10-CM | POA: Diagnosis not present

## 2017-08-09 DIAGNOSIS — E559 Vitamin D deficiency, unspecified: Secondary | ICD-10-CM | POA: Diagnosis not present

## 2017-08-13 DIAGNOSIS — I10 Essential (primary) hypertension: Secondary | ICD-10-CM | POA: Diagnosis not present

## 2017-08-13 DIAGNOSIS — E559 Vitamin D deficiency, unspecified: Secondary | ICD-10-CM | POA: Diagnosis not present

## 2017-08-13 DIAGNOSIS — B351 Tinea unguium: Secondary | ICD-10-CM | POA: Diagnosis not present

## 2017-08-13 DIAGNOSIS — K580 Irritable bowel syndrome with diarrhea: Secondary | ICD-10-CM | POA: Diagnosis not present

## 2017-08-13 DIAGNOSIS — E1143 Type 2 diabetes mellitus with diabetic autonomic (poly)neuropathy: Secondary | ICD-10-CM | POA: Diagnosis not present

## 2017-08-13 DIAGNOSIS — R51 Headache: Secondary | ICD-10-CM | POA: Diagnosis not present

## 2017-08-13 DIAGNOSIS — R3915 Urgency of urination: Secondary | ICD-10-CM | POA: Diagnosis not present

## 2017-08-13 DIAGNOSIS — M3210 Systemic lupus erythematosus, organ or system involvement unspecified: Secondary | ICD-10-CM | POA: Diagnosis not present

## 2017-08-13 DIAGNOSIS — E1165 Type 2 diabetes mellitus with hyperglycemia: Secondary | ICD-10-CM | POA: Diagnosis not present

## 2017-08-13 DIAGNOSIS — K210 Gastro-esophageal reflux disease with esophagitis: Secondary | ICD-10-CM | POA: Diagnosis not present

## 2017-08-13 DIAGNOSIS — Z6827 Body mass index (BMI) 27.0-27.9, adult: Secondary | ICD-10-CM | POA: Diagnosis not present

## 2017-08-13 DIAGNOSIS — Z23 Encounter for immunization: Secondary | ICD-10-CM | POA: Diagnosis not present

## 2017-08-13 DIAGNOSIS — G619 Inflammatory polyneuropathy, unspecified: Secondary | ICD-10-CM | POA: Diagnosis not present

## 2017-08-13 DIAGNOSIS — F411 Generalized anxiety disorder: Secondary | ICD-10-CM | POA: Diagnosis not present

## 2017-08-24 ENCOUNTER — Ambulatory Visit (INDEPENDENT_AMBULATORY_CARE_PROVIDER_SITE_OTHER): Payer: Medicare Other | Admitting: Ophthalmology

## 2017-08-24 DIAGNOSIS — H4423 Degenerative myopia, bilateral: Secondary | ICD-10-CM | POA: Diagnosis not present

## 2017-08-24 DIAGNOSIS — H35033 Hypertensive retinopathy, bilateral: Secondary | ICD-10-CM | POA: Diagnosis not present

## 2017-08-24 DIAGNOSIS — I10 Essential (primary) hypertension: Secondary | ICD-10-CM

## 2017-08-24 DIAGNOSIS — H43813 Vitreous degeneration, bilateral: Secondary | ICD-10-CM | POA: Diagnosis not present

## 2017-09-07 ENCOUNTER — Encounter (INDEPENDENT_AMBULATORY_CARE_PROVIDER_SITE_OTHER): Payer: Self-pay

## 2017-09-07 ENCOUNTER — Ambulatory Visit (INDEPENDENT_AMBULATORY_CARE_PROVIDER_SITE_OTHER): Payer: Medicare Other | Admitting: Neurology

## 2017-09-07 ENCOUNTER — Encounter: Payer: Self-pay | Admitting: Neurology

## 2017-09-07 VITALS — BP 174/88 | HR 61 | Ht 66.0 in | Wt 186.5 lb

## 2017-09-07 DIAGNOSIS — H532 Diplopia: Secondary | ICD-10-CM | POA: Diagnosis not present

## 2017-09-07 DIAGNOSIS — G4489 Other headache syndrome: Secondary | ICD-10-CM | POA: Diagnosis not present

## 2017-09-07 MED ORDER — TOPIRAMATE 25 MG PO TABS: 75.0000 mg | ORAL_TABLET | Freq: Every day | ORAL | 3 refills | Status: AC

## 2017-09-07 NOTE — Patient Instructions (Signed)
  We will get a referral to Dr. Sanda Klein at [facility] for a second opinion.

## 2017-09-07 NOTE — Progress Notes (Signed)
Reason for visit: Double vision  [name] Zimmerman is an 67 y.o. female  History of present illness:  [name] Zimmerman is a 67 year old right-handed white female with a history of double vision that has been intermittent in nature.  She reports that she mainly sees double when she is looking straight ahead, previously it had been mainly horizontal in nature but now that there may be some vertical component to it as well.  The episodes may last 20 minutes or so, and may occur 1 or 2 times a week.  The patient denies any ptosis, she has no weakness of the extremities.  She does have a history of migraine headaches that have been significantly improved on Topamax currently taking 75 mg at night.  The patient does have a history of macular degeneration and she is followed for this.  She indicates that oftentimes the double vision is worse in the morning or at nighttime when she goes to the bathroom.  She denies any other new symptoms.  She was given a trial on prednisone 50 mg a day for 6 weeks, this did not offer any benefit with her double vision.  She could not tolerate Mestinon.  She is felt to have Sjogren's syndrome/lupus syndrome.  She has been seen through rheumatology previously.  She returns to this office for evaluation.   Past Medical History:  Diagnosis Date  . Basal cell carcinoma   . Diabetes ([place])   . IBS (irritable bowel syndrome)   . Lupus   . Neuropathy     Past Surgical History:  Procedure Laterality Date  . BIOPSY BREAST Right    negative  . GALLBLADDER SURGERY    . TENDON EXPLORATION     arms/hand    Family History  Problem Relation Age of Onset  . Cerebral aneurysm Mother   . High blood pressure Mother   . Thyroid disease Mother   . COPD Father     Social history:  reports that she has quit smoking. She has never used smokeless tobacco. She reports that she does not drink alcohol or use drugs.    Allergies  Allergen Reactions  . Clindamycin/Lincomycin Hives  .  Sulfa Antibiotics Nausea And Vomiting    Medications:  Prior to Admission medications   Medication Sig Start Date End Date Taking? Authorizing Provider  Cholecalciferol (VITAMIN D PO) Take 2,000 Units by mouth daily. With calcium   Yes [provider]  diphenhydramine-acetaminophen (TYLENOL PM) 25-500 MG TABS tablet Take 1 tablet by mouth at bedtime as needed.   Yes [provider]  FLUoxetine (PROZAC) 20 MG capsule Take 20 mg by mouth daily. 01/23/17  Yes [provider]  lisinopril-hydrochlorothiazide (PRINZIDE,ZESTORETIC) 10-12.5 MG tablet Take 1 tablet by mouth daily.   Yes [provider]  pioglitazone (ACTOS) 15 MG tablet Take 15 mg by mouth daily. 01/05/17  Yes [provider]  [facility] injection Inject 0.5 mLs into the muscle once.  01/16/17  Yes [provider]  topiramate (TOPAMAX) 25 MG tablet Take 3 tablets (75 mg total) by mouth at bedtime. 09/07/17  Yes Kathrynn Ducking, MD  zolpidem (AMBIEN) 10 MG tablet Take 10 mg by mouth at bedtime as needed for sleep.   Yes [provider]  aspirin-acetaminophen-caffeine (EXCEDRIN MIGRAINE) [phone] MG tablet Take 1 tablet by mouth every 6 (six) hours as needed for headache.    [provider]    ROS:  Out of a complete 14 system review of  symptoms, the patient complains only of the following symptoms, and all other reviewed systems are negative.  Fatigue Eye itching, double vision, blurred vision Leg swelling Snoring  Blood pressure (!) 174/88, pulse 61, height 5\' 6"  (1.676 m), weight 186 lb 8 oz (84.6 kg).  Physical Exam  General: The patient is alert and cooperative at the time of the examination.  Skin: 1+ edema at the ankles is seen bilaterally.   Neurologic Exam  Mental status: The patient is alert and oriented x 3 at the time of the examination. The patient has apparent normal recent and remote memory, with an apparently normal attention span and  concentration ability.   Cranial nerves: Facial symmetry is present. Speech is normal, no aphasia or dysarthria is noted. Extraocular movements are full. Visual fields are full.  Motor: The patient has good strength in all 4 extremities.  Sensory examination: Soft touch sensation is symmetric on the face, arms, and legs.  Coordination: The patient has good finger-nose-finger and heel-to-shin bilaterally.  Gait and station: The patient has a normal gait. Tandem gait is normal. Romberg is negative. No drift is seen.  Reflexes: Deep tendon reflexes are symmetric.   Assessment/Plan:  1.  Intermittent double vision  2.  Migraine headache  3.  Lupus/Sjogren's syndrome  The patient has done much better with her migraines on Topamax, a prescription for Topamax was sent in.  The patient still has double vision that has not improved with a 6-week course of prednisone.  The patient will be sent for a second opinion through Dr. Sanda Klein at [facility].  The patient will follow up to this office in about 6 months.  Jill Alexanders MD 09/07/2017 9:08 AM  [facility] [address] [place], [place] [address]  Phone [phone] Fax [phone]

## 2017-09-17 DIAGNOSIS — H35433 Paving stone degeneration of retina, bilateral: Secondary | ICD-10-CM | POA: Diagnosis not present

## 2017-09-17 DIAGNOSIS — H532 Diplopia: Secondary | ICD-10-CM | POA: Diagnosis not present

## 2017-09-17 DIAGNOSIS — H518 Other specified disorders of binocular movement: Secondary | ICD-10-CM | POA: Diagnosis not present

## 2017-09-17 DIAGNOSIS — H35413 Lattice degeneration of retina, bilateral: Secondary | ICD-10-CM | POA: Diagnosis not present

## 2017-09-17 DIAGNOSIS — Z961 Presence of intraocular lens: Secondary | ICD-10-CM | POA: Diagnosis not present

## 2017-09-17 DIAGNOSIS — H43813 Vitreous degeneration, bilateral: Secondary | ICD-10-CM | POA: Diagnosis not present

## 2017-10-30 DIAGNOSIS — H5022 Vertical strabismus, left eye: Secondary | ICD-10-CM | POA: Diagnosis not present

## 2017-10-30 DIAGNOSIS — Z882 Allergy status to sulfonamides status: Secondary | ICD-10-CM | POA: Diagnosis not present

## 2017-10-30 DIAGNOSIS — H50312 Intermittent monocular esotropia, left eye: Secondary | ICD-10-CM | POA: Diagnosis not present

## 2017-10-30 DIAGNOSIS — H532 Diplopia: Secondary | ICD-10-CM | POA: Diagnosis not present

## 2017-10-30 DIAGNOSIS — Z881 Allergy status to other antibiotic agents status: Secondary | ICD-10-CM | POA: Diagnosis not present

## 2017-12-17 DIAGNOSIS — E781 Pure hyperglyceridemia: Secondary | ICD-10-CM | POA: Diagnosis not present

## 2017-12-17 DIAGNOSIS — I10 Essential (primary) hypertension: Secondary | ICD-10-CM | POA: Diagnosis not present

## 2017-12-17 DIAGNOSIS — E1165 Type 2 diabetes mellitus with hyperglycemia: Secondary | ICD-10-CM | POA: Diagnosis not present

## 2017-12-17 DIAGNOSIS — M3210 Systemic lupus erythematosus, organ or system involvement unspecified: Secondary | ICD-10-CM | POA: Diagnosis not present

## 2017-12-17 DIAGNOSIS — E559 Vitamin D deficiency, unspecified: Secondary | ICD-10-CM | POA: Diagnosis not present

## 2017-12-17 DIAGNOSIS — K210 Gastro-esophageal reflux disease with esophagitis: Secondary | ICD-10-CM | POA: Diagnosis not present

## 2017-12-17 DIAGNOSIS — E1143 Type 2 diabetes mellitus with diabetic autonomic (poly)neuropathy: Secondary | ICD-10-CM | POA: Diagnosis not present

## 2017-12-20 DIAGNOSIS — E559 Vitamin D deficiency, unspecified: Secondary | ICD-10-CM | POA: Diagnosis not present

## 2017-12-20 DIAGNOSIS — I10 Essential (primary) hypertension: Secondary | ICD-10-CM | POA: Diagnosis not present

## 2017-12-20 DIAGNOSIS — Z0001 Encounter for general adult medical examination with abnormal findings: Secondary | ICD-10-CM | POA: Diagnosis not present

## 2017-12-20 DIAGNOSIS — R51 Headache: Secondary | ICD-10-CM | POA: Diagnosis not present

## 2017-12-20 DIAGNOSIS — K580 Irritable bowel syndrome with diarrhea: Secondary | ICD-10-CM | POA: Diagnosis not present

## 2017-12-20 DIAGNOSIS — E1165 Type 2 diabetes mellitus with hyperglycemia: Secondary | ICD-10-CM | POA: Diagnosis not present

## 2017-12-20 DIAGNOSIS — M3210 Systemic lupus erythematosus, organ or system involvement unspecified: Secondary | ICD-10-CM | POA: Diagnosis not present

## 2017-12-20 DIAGNOSIS — E1143 Type 2 diabetes mellitus with diabetic autonomic (poly)neuropathy: Secondary | ICD-10-CM | POA: Diagnosis not present

## 2017-12-20 DIAGNOSIS — G619 Inflammatory polyneuropathy, unspecified: Secondary | ICD-10-CM | POA: Diagnosis not present

## 2018-01-14 ENCOUNTER — Encounter (INDEPENDENT_AMBULATORY_CARE_PROVIDER_SITE_OTHER): Payer: Self-pay

## 2018-02-26 DIAGNOSIS — H50312 Intermittent monocular esotropia, left eye: Secondary | ICD-10-CM | POA: Diagnosis not present

## 2018-02-26 DIAGNOSIS — H5022 Vertical strabismus, left eye: Secondary | ICD-10-CM | POA: Diagnosis not present

## 2018-03-07 DIAGNOSIS — J069 Acute upper respiratory infection, unspecified: Secondary | ICD-10-CM | POA: Diagnosis not present

## 2018-03-07 DIAGNOSIS — J0101 Acute recurrent maxillary sinusitis: Secondary | ICD-10-CM | POA: Diagnosis not present

## 2018-03-07 DIAGNOSIS — J209 Acute bronchitis, unspecified: Secondary | ICD-10-CM | POA: Diagnosis not present

## 2018-03-07 DIAGNOSIS — R05 Cough: Secondary | ICD-10-CM | POA: Diagnosis not present

## 2018-03-07 DIAGNOSIS — Z6828 Body mass index (BMI) 28.0-28.9, adult: Secondary | ICD-10-CM | POA: Diagnosis not present

## 2018-03-21 ENCOUNTER — Ambulatory Visit: Payer: Medicare Other | Admitting: Neurology

## 2018-03-23 DIAGNOSIS — Z23 Encounter for immunization: Secondary | ICD-10-CM | POA: Diagnosis not present

## 2018-04-19 DIAGNOSIS — I10 Essential (primary) hypertension: Secondary | ICD-10-CM | POA: Diagnosis not present

## 2018-04-19 DIAGNOSIS — E781 Pure hyperglyceridemia: Secondary | ICD-10-CM | POA: Diagnosis not present

## 2018-04-19 DIAGNOSIS — E1143 Type 2 diabetes mellitus with diabetic autonomic (poly)neuropathy: Secondary | ICD-10-CM | POA: Diagnosis not present

## 2018-04-19 DIAGNOSIS — E559 Vitamin D deficiency, unspecified: Secondary | ICD-10-CM | POA: Diagnosis not present

## 2018-04-19 DIAGNOSIS — E1165 Type 2 diabetes mellitus with hyperglycemia: Secondary | ICD-10-CM | POA: Diagnosis not present

## 2018-04-24 DIAGNOSIS — F411 Generalized anxiety disorder: Secondary | ICD-10-CM | POA: Diagnosis not present

## 2018-04-24 DIAGNOSIS — G252 Other specified forms of tremor: Secondary | ICD-10-CM | POA: Diagnosis not present

## 2018-04-24 DIAGNOSIS — Z1331 Encounter for screening for depression: Secondary | ICD-10-CM | POA: Diagnosis not present

## 2018-04-24 DIAGNOSIS — Z6828 Body mass index (BMI) 28.0-28.9, adult: Secondary | ICD-10-CM | POA: Diagnosis not present

## 2018-04-24 DIAGNOSIS — E1165 Type 2 diabetes mellitus with hyperglycemia: Secondary | ICD-10-CM | POA: Diagnosis not present

## 2018-04-24 DIAGNOSIS — I10 Essential (primary) hypertension: Secondary | ICD-10-CM | POA: Diagnosis not present

## 2018-04-24 DIAGNOSIS — Z1389 Encounter for screening for other disorder: Secondary | ICD-10-CM | POA: Diagnosis not present

## 2018-04-24 DIAGNOSIS — M3210 Systemic lupus erythematosus, organ or system involvement unspecified: Secondary | ICD-10-CM | POA: Diagnosis not present

## 2018-05-30 ENCOUNTER — Ambulatory Visit: Payer: Medicare Other | Admitting: Neurology

## 2018-06-08 DIAGNOSIS — R300 Dysuria: Secondary | ICD-10-CM | POA: Diagnosis not present

## 2018-06-08 DIAGNOSIS — Z6828 Body mass index (BMI) 28.0-28.9, adult: Secondary | ICD-10-CM | POA: Diagnosis not present

## 2018-06-08 DIAGNOSIS — N3090 Cystitis, unspecified without hematuria: Secondary | ICD-10-CM | POA: Diagnosis not present

## 2018-06-19 DIAGNOSIS — H5022 Vertical strabismus, left eye: Secondary | ICD-10-CM | POA: Diagnosis not present

## 2018-06-19 DIAGNOSIS — H50312 Intermittent monocular esotropia, left eye: Secondary | ICD-10-CM | POA: Diagnosis not present

## 2018-07-23 ENCOUNTER — Ambulatory Visit: Payer: Medicare Other | Admitting: Neurology

## 2018-07-23 ENCOUNTER — Encounter

## 2018-08-12 DIAGNOSIS — N3000 Acute cystitis without hematuria: Secondary | ICD-10-CM | POA: Diagnosis not present

## 2018-08-12 DIAGNOSIS — Z6828 Body mass index (BMI) 28.0-28.9, adult: Secondary | ICD-10-CM | POA: Diagnosis not present

## 2018-08-12 DIAGNOSIS — M329 Systemic lupus erythematosus, unspecified: Secondary | ICD-10-CM | POA: Diagnosis not present

## 2018-08-16 DIAGNOSIS — E1169 Type 2 diabetes mellitus with other specified complication: Secondary | ICD-10-CM | POA: Diagnosis not present

## 2018-08-16 DIAGNOSIS — E559 Vitamin D deficiency, unspecified: Secondary | ICD-10-CM | POA: Diagnosis not present

## 2018-08-16 DIAGNOSIS — E1143 Type 2 diabetes mellitus with diabetic autonomic (poly)neuropathy: Secondary | ICD-10-CM | POA: Diagnosis not present

## 2018-08-16 DIAGNOSIS — I10 Essential (primary) hypertension: Secondary | ICD-10-CM | POA: Diagnosis not present

## 2018-08-16 DIAGNOSIS — R5383 Other fatigue: Secondary | ICD-10-CM | POA: Diagnosis not present

## 2018-08-16 DIAGNOSIS — K210 Gastro-esophageal reflux disease with esophagitis: Secondary | ICD-10-CM | POA: Diagnosis not present

## 2018-08-16 DIAGNOSIS — E782 Mixed hyperlipidemia: Secondary | ICD-10-CM | POA: Diagnosis not present

## 2018-08-19 ENCOUNTER — Telehealth: Payer: Self-pay

## 2018-08-19 DIAGNOSIS — G619 Inflammatory polyneuropathy, unspecified: Secondary | ICD-10-CM | POA: Diagnosis not present

## 2018-08-19 DIAGNOSIS — Z6829 Body mass index (BMI) 29.0-29.9, adult: Secondary | ICD-10-CM | POA: Diagnosis not present

## 2018-08-19 DIAGNOSIS — I10 Essential (primary) hypertension: Secondary | ICD-10-CM | POA: Diagnosis not present

## 2018-08-19 DIAGNOSIS — E559 Vitamin D deficiency, unspecified: Secondary | ICD-10-CM | POA: Diagnosis not present

## 2018-08-19 DIAGNOSIS — M3210 Systemic lupus erythematosus, organ or system involvement unspecified: Secondary | ICD-10-CM | POA: Diagnosis not present

## 2018-08-19 DIAGNOSIS — E1165 Type 2 diabetes mellitus with hyperglycemia: Secondary | ICD-10-CM | POA: Diagnosis not present

## 2018-08-19 DIAGNOSIS — R51 Headache: Secondary | ICD-10-CM | POA: Diagnosis not present

## 2018-08-19 DIAGNOSIS — E1143 Type 2 diabetes mellitus with diabetic autonomic (poly)neuropathy: Secondary | ICD-10-CM | POA: Diagnosis not present

## 2018-08-19 NOTE — Telephone Encounter (Signed)
I contacted the pt. She was original schedule for 09/02/18 at 12 pm. Dr. Jannifer Franklin requested this appt to be canceled due to a committee meeting he has to attend. Pt rescheduled for 09/13/18 at 12 pm check in time of 11:30 am.

## 2018-08-22 DIAGNOSIS — Z8739 Personal history of other diseases of the musculoskeletal system and connective tissue: Secondary | ICD-10-CM | POA: Diagnosis not present

## 2018-08-22 DIAGNOSIS — M2550 Pain in unspecified joint: Secondary | ICD-10-CM | POA: Diagnosis not present

## 2018-08-22 DIAGNOSIS — R5383 Other fatigue: Secondary | ICD-10-CM | POA: Diagnosis not present

## 2018-08-22 DIAGNOSIS — M1990 Unspecified osteoarthritis, unspecified site: Secondary | ICD-10-CM | POA: Diagnosis not present

## 2018-08-22 DIAGNOSIS — E119 Type 2 diabetes mellitus without complications: Secondary | ICD-10-CM | POA: Diagnosis not present

## 2018-08-22 DIAGNOSIS — M7989 Other specified soft tissue disorders: Secondary | ICD-10-CM | POA: Diagnosis not present

## 2018-08-22 DIAGNOSIS — D696 Thrombocytopenia, unspecified: Secondary | ICD-10-CM | POA: Diagnosis not present

## 2018-08-22 DIAGNOSIS — M3500 Sicca syndrome, unspecified: Secondary | ICD-10-CM | POA: Diagnosis not present

## 2018-08-22 DIAGNOSIS — R768 Other specified abnormal immunological findings in serum: Secondary | ICD-10-CM | POA: Diagnosis not present

## 2018-08-22 DIAGNOSIS — Z6830 Body mass index (BMI) 30.0-30.9, adult: Secondary | ICD-10-CM | POA: Diagnosis not present

## 2018-08-22 DIAGNOSIS — E669 Obesity, unspecified: Secondary | ICD-10-CM | POA: Diagnosis not present

## 2018-08-26 ENCOUNTER — Encounter (INDEPENDENT_AMBULATORY_CARE_PROVIDER_SITE_OTHER): Payer: Medicare Other | Admitting: Ophthalmology

## 2018-09-02 ENCOUNTER — Ambulatory Visit: Payer: Medicare Other | Admitting: Neurology

## 2018-09-09 ENCOUNTER — Telehealth: Payer: Self-pay

## 2018-09-09 NOTE — Telephone Encounter (Signed)
I contacted the pt in regards to her 09/13/18 appt. Pt was advised Due to current COVID 19 pandemic, our office is severely reducing in office visits for at least the next 2 weeks, in order to minimize the risk to our patients and healthcare providers.   Pt was also advised Dr. Jannifer Franklin could complete a telephone call on 09/12/18 at 11:30.   Pt understands that although there may be some limitations with this type of visit, we will take all precautions to reduce any security or privacy concerns.  Pt understands that this will be treated like an in office visit and we will file with pt's insurance, and there may be a patient responsible charge related to this service.

## 2018-09-11 ENCOUNTER — Other Ambulatory Visit: Payer: Self-pay

## 2018-09-11 ENCOUNTER — Encounter: Payer: Self-pay | Admitting: Neurology

## 2018-09-11 ENCOUNTER — Ambulatory Visit (INDEPENDENT_AMBULATORY_CARE_PROVIDER_SITE_OTHER): Payer: Medicare Other | Admitting: Neurology

## 2018-09-11 DIAGNOSIS — I671 Cerebral aneurysm, nonruptured: Secondary | ICD-10-CM

## 2018-09-11 DIAGNOSIS — G43009 Migraine without aura, not intractable, without status migrainosus: Secondary | ICD-10-CM | POA: Diagnosis not present

## 2018-09-11 DIAGNOSIS — G43909 Migraine, unspecified, not intractable, without status migrainosus: Secondary | ICD-10-CM | POA: Insufficient documentation

## 2018-09-11 HISTORY — DX: Cerebral aneurysm, nonruptured: I67.1

## 2018-09-11 HISTORY — DX: Migraine, unspecified, not intractable, without status migrainosus: G43.909

## 2018-09-11 MED ORDER — ROSUVASTATIN CALCIUM 5 MG PO TABS: 5.0000 mg | ORAL_TABLET | ORAL | Status: DC

## 2018-09-11 NOTE — Addendum Note (Signed)
Addended by: Kathrynn Ducking on: 09/11/2018 11:58 AM   Modules accepted: Orders

## 2018-09-11 NOTE — Progress Notes (Signed)
     Virtual Visit via Telephone Note  I connected with [name] on 09/11/18 at 11:30 AM EDT by telephone and verified that I am speaking with the correct person using two identifiers.   I discussed the limitations, risks, security and privacy concerns of performing an evaluation and management service by telephone and the availability of in person appointments. I also discussed with the patient that there may be a patient responsible charge related to this service. The patient expressed understanding and agreed to proceed.   History of Present Illness: [name] is a 68 year old right-handed white female with a history of migraine headaches, she has done quite well on Topamax.  She also has a lupus type syndrome, she has had a significant exacerbation of this within the last month with swelling and pain in the hands, she is followed by Dr. Veleta Miners.  She has been placed on oral prednisone and IV methotrexate, she is also on Crestor taking 5 mg every other day.  She is having very minimal problems with headaches, she occasionally may have a low-grade headache and does not require medical therapy for this.  She is quite pleased with her headache treatment and wishes to come off of the Topamax if this is not absolutely necessary.  She currently takes 25 mg at night.  She tolerates the Topamax fairly well, but she does have problems with gastroesophageal reflux disease.  She has been seen by Dr. Sanda Klein for her double vision previously, this was felt to be secondary to  divergence insufficiency.  She has gotten prisms made for her glasses which have improved her double vision significantly.   Observations/Objective: Telephone evaluation revealed that the patient is alert and cooperative, she responds to questions appropriately.  She has a normal speech pattern, no dysarthria or aphasia is noted.  Assessment and Plan: 1.  Double vision, divergence insufficiency  2.  Migraine headache  3.   Lupus  4.  Cerebral aneurysm, left carotid supraclinoid aneurysm  The patient is doing well with her headaches, we will taper off of the Topamax by 1 tablet every 3 weeks until off the drug, if her headaches do not come back, she will stay off of Topamax and follow-up here if needed.  The patient has a small left supraclinoid carotid artery aneurysm, we will check another MRA of the head in August 2020.  Follow Up Instructions: The patient will follow-up through this office if needed.   I discussed the assessment and treatment plan with the patient. The patient was provided an opportunity to ask questions and all were answered. The patient agreed with the plan and demonstrated an understanding of the instructions.   The patient was advised to call back or seek an in-person evaluation if the symptoms worsen or if the condition fails to improve as anticipated.  I provided 25 minutes of non-face-to-face time during this encounter.   Kathrynn Ducking, MD

## 2018-09-13 ENCOUNTER — Ambulatory Visit: Payer: Self-pay | Admitting: Neurology

## 2018-09-24 DIAGNOSIS — K120 Recurrent oral aphthae: Secondary | ICD-10-CM | POA: Diagnosis not present

## 2018-09-24 DIAGNOSIS — D696 Thrombocytopenia, unspecified: Secondary | ICD-10-CM | POA: Diagnosis not present

## 2018-09-24 DIAGNOSIS — Z79899 Other long term (current) drug therapy: Secondary | ICD-10-CM | POA: Diagnosis not present

## 2018-09-24 DIAGNOSIS — M3500 Sicca syndrome, unspecified: Secondary | ICD-10-CM | POA: Diagnosis not present

## 2018-09-24 DIAGNOSIS — R5383 Other fatigue: Secondary | ICD-10-CM | POA: Diagnosis not present

## 2018-09-24 DIAGNOSIS — M7989 Other specified soft tissue disorders: Secondary | ICD-10-CM | POA: Diagnosis not present

## 2018-09-24 DIAGNOSIS — Z6829 Body mass index (BMI) 29.0-29.9, adult: Secondary | ICD-10-CM | POA: Diagnosis not present

## 2018-09-24 DIAGNOSIS — R768 Other specified abnormal immunological findings in serum: Secondary | ICD-10-CM | POA: Diagnosis not present

## 2018-09-24 DIAGNOSIS — M1990 Unspecified osteoarthritis, unspecified site: Secondary | ICD-10-CM | POA: Diagnosis not present

## 2018-09-24 DIAGNOSIS — R2231 Localized swelling, mass and lump, right upper limb: Secondary | ICD-10-CM | POA: Diagnosis not present

## 2018-09-24 DIAGNOSIS — E663 Overweight: Secondary | ICD-10-CM | POA: Diagnosis not present

## 2018-09-26 DIAGNOSIS — M069 Rheumatoid arthritis, unspecified: Secondary | ICD-10-CM | POA: Diagnosis not present

## 2018-09-26 DIAGNOSIS — E1165 Type 2 diabetes mellitus with hyperglycemia: Secondary | ICD-10-CM | POA: Diagnosis not present

## 2018-09-26 DIAGNOSIS — Z6828 Body mass index (BMI) 28.0-28.9, adult: Secondary | ICD-10-CM | POA: Diagnosis not present

## 2018-09-26 DIAGNOSIS — M329 Systemic lupus erythematosus, unspecified: Secondary | ICD-10-CM | POA: Diagnosis not present

## 2018-09-26 DIAGNOSIS — E782 Mixed hyperlipidemia: Secondary | ICD-10-CM | POA: Diagnosis not present

## 2018-09-30 ENCOUNTER — Encounter (INDEPENDENT_AMBULATORY_CARE_PROVIDER_SITE_OTHER): Payer: Medicare Other | Admitting: Ophthalmology

## 2018-10-22 DIAGNOSIS — D696 Thrombocytopenia, unspecified: Secondary | ICD-10-CM | POA: Diagnosis not present

## 2018-10-22 DIAGNOSIS — K120 Recurrent oral aphthae: Secondary | ICD-10-CM | POA: Diagnosis not present

## 2018-10-22 DIAGNOSIS — Z6829 Body mass index (BMI) 29.0-29.9, adult: Secondary | ICD-10-CM | POA: Diagnosis not present

## 2018-10-22 DIAGNOSIS — E663 Overweight: Secondary | ICD-10-CM | POA: Diagnosis not present

## 2018-10-22 DIAGNOSIS — R2231 Localized swelling, mass and lump, right upper limb: Secondary | ICD-10-CM | POA: Diagnosis not present

## 2018-10-22 DIAGNOSIS — M3500 Sicca syndrome, unspecified: Secondary | ICD-10-CM | POA: Diagnosis not present

## 2018-10-22 DIAGNOSIS — M7989 Other specified soft tissue disorders: Secondary | ICD-10-CM | POA: Diagnosis not present

## 2018-10-22 DIAGNOSIS — M1990 Unspecified osteoarthritis, unspecified site: Secondary | ICD-10-CM | POA: Diagnosis not present

## 2018-10-22 DIAGNOSIS — R768 Other specified abnormal immunological findings in serum: Secondary | ICD-10-CM | POA: Diagnosis not present

## 2018-10-22 DIAGNOSIS — R5383 Other fatigue: Secondary | ICD-10-CM | POA: Diagnosis not present

## 2018-11-20 DIAGNOSIS — D696 Thrombocytopenia, unspecified: Secondary | ICD-10-CM | POA: Diagnosis not present

## 2018-11-20 DIAGNOSIS — M3500 Sicca syndrome, unspecified: Secondary | ICD-10-CM | POA: Diagnosis not present

## 2018-11-20 DIAGNOSIS — M7989 Other specified soft tissue disorders: Secondary | ICD-10-CM | POA: Diagnosis not present

## 2018-11-20 DIAGNOSIS — R2231 Localized swelling, mass and lump, right upper limb: Secondary | ICD-10-CM | POA: Diagnosis not present

## 2018-11-20 DIAGNOSIS — R5383 Other fatigue: Secondary | ICD-10-CM | POA: Diagnosis not present

## 2018-11-20 DIAGNOSIS — M1990 Unspecified osteoarthritis, unspecified site: Secondary | ICD-10-CM | POA: Diagnosis not present

## 2018-11-20 DIAGNOSIS — F419 Anxiety disorder, unspecified: Secondary | ICD-10-CM | POA: Diagnosis not present

## 2018-11-20 DIAGNOSIS — K120 Recurrent oral aphthae: Secondary | ICD-10-CM | POA: Diagnosis not present

## 2018-11-20 DIAGNOSIS — R768 Other specified abnormal immunological findings in serum: Secondary | ICD-10-CM | POA: Diagnosis not present

## 2018-11-20 DIAGNOSIS — R29898 Other symptoms and signs involving the musculoskeletal system: Secondary | ICD-10-CM | POA: Diagnosis not present

## 2018-12-03 ENCOUNTER — Encounter (INDEPENDENT_AMBULATORY_CARE_PROVIDER_SITE_OTHER): Payer: Medicare Other | Admitting: Ophthalmology

## 2018-12-05 DIAGNOSIS — R300 Dysuria: Secondary | ICD-10-CM | POA: Diagnosis not present

## 2018-12-05 DIAGNOSIS — M069 Rheumatoid arthritis, unspecified: Secondary | ICD-10-CM | POA: Diagnosis not present

## 2018-12-05 DIAGNOSIS — R5383 Other fatigue: Secondary | ICD-10-CM | POA: Diagnosis not present

## 2018-12-05 DIAGNOSIS — M329 Systemic lupus erythematosus, unspecified: Secondary | ICD-10-CM | POA: Diagnosis not present

## 2018-12-12 DIAGNOSIS — R509 Fever, unspecified: Secondary | ICD-10-CM | POA: Diagnosis not present

## 2018-12-12 DIAGNOSIS — E559 Vitamin D deficiency, unspecified: Secondary | ICD-10-CM | POA: Diagnosis not present

## 2018-12-12 DIAGNOSIS — R5383 Other fatigue: Secondary | ICD-10-CM | POA: Diagnosis not present

## 2018-12-12 DIAGNOSIS — E782 Mixed hyperlipidemia: Secondary | ICD-10-CM | POA: Diagnosis not present

## 2018-12-12 DIAGNOSIS — I10 Essential (primary) hypertension: Secondary | ICD-10-CM | POA: Diagnosis not present

## 2018-12-12 DIAGNOSIS — E1143 Type 2 diabetes mellitus with diabetic autonomic (poly)neuropathy: Secondary | ICD-10-CM | POA: Diagnosis not present

## 2018-12-18 DIAGNOSIS — F411 Generalized anxiety disorder: Secondary | ICD-10-CM | POA: Diagnosis not present

## 2018-12-18 DIAGNOSIS — R51 Headache: Secondary | ICD-10-CM | POA: Diagnosis not present

## 2018-12-18 DIAGNOSIS — E559 Vitamin D deficiency, unspecified: Secondary | ICD-10-CM | POA: Diagnosis not present

## 2018-12-18 DIAGNOSIS — E782 Mixed hyperlipidemia: Secondary | ICD-10-CM | POA: Diagnosis not present

## 2018-12-18 DIAGNOSIS — E1165 Type 2 diabetes mellitus with hyperglycemia: Secondary | ICD-10-CM | POA: Diagnosis not present

## 2018-12-18 DIAGNOSIS — K580 Irritable bowel syndrome with diarrhea: Secondary | ICD-10-CM | POA: Diagnosis not present

## 2018-12-18 DIAGNOSIS — Z6828 Body mass index (BMI) 28.0-28.9, adult: Secondary | ICD-10-CM | POA: Diagnosis not present

## 2018-12-18 DIAGNOSIS — I10 Essential (primary) hypertension: Secondary | ICD-10-CM | POA: Diagnosis not present

## 2018-12-18 DIAGNOSIS — Z0001 Encounter for general adult medical examination with abnormal findings: Secondary | ICD-10-CM | POA: Diagnosis not present

## 2018-12-18 DIAGNOSIS — M3210 Systemic lupus erythematosus, organ or system involvement unspecified: Secondary | ICD-10-CM | POA: Diagnosis not present

## 2018-12-18 DIAGNOSIS — E1143 Type 2 diabetes mellitus with diabetic autonomic (poly)neuropathy: Secondary | ICD-10-CM | POA: Diagnosis not present

## 2018-12-18 DIAGNOSIS — G619 Inflammatory polyneuropathy, unspecified: Secondary | ICD-10-CM | POA: Diagnosis not present

## 2018-12-18 DIAGNOSIS — M069 Rheumatoid arthritis, unspecified: Secondary | ICD-10-CM | POA: Diagnosis not present

## 2018-12-18 DIAGNOSIS — K210 Gastro-esophageal reflux disease with esophagitis: Secondary | ICD-10-CM | POA: Diagnosis not present

## 2018-12-24 DIAGNOSIS — H43393 Other vitreous opacities, bilateral: Secondary | ICD-10-CM | POA: Diagnosis not present

## 2019-01-01 DIAGNOSIS — M2550 Pain in unspecified joint: Secondary | ICD-10-CM | POA: Diagnosis not present

## 2019-01-20 DIAGNOSIS — R2231 Localized swelling, mass and lump, right upper limb: Secondary | ICD-10-CM | POA: Diagnosis not present

## 2019-01-20 DIAGNOSIS — M3500 Sicca syndrome, unspecified: Secondary | ICD-10-CM | POA: Diagnosis not present

## 2019-01-20 DIAGNOSIS — K120 Recurrent oral aphthae: Secondary | ICD-10-CM | POA: Diagnosis not present

## 2019-01-20 DIAGNOSIS — R29898 Other symptoms and signs involving the musculoskeletal system: Secondary | ICD-10-CM | POA: Diagnosis not present

## 2019-01-20 DIAGNOSIS — F419 Anxiety disorder, unspecified: Secondary | ICD-10-CM | POA: Diagnosis not present

## 2019-01-20 DIAGNOSIS — M1990 Unspecified osteoarthritis, unspecified site: Secondary | ICD-10-CM | POA: Diagnosis not present

## 2019-01-20 DIAGNOSIS — M0579 Rheumatoid arthritis with rheumatoid factor of multiple sites without organ or systems involvement: Secondary | ICD-10-CM | POA: Diagnosis not present

## 2019-01-20 DIAGNOSIS — D696 Thrombocytopenia, unspecified: Secondary | ICD-10-CM | POA: Diagnosis not present

## 2019-01-20 DIAGNOSIS — R5383 Other fatigue: Secondary | ICD-10-CM | POA: Diagnosis not present

## 2019-01-20 DIAGNOSIS — Z79899 Other long term (current) drug therapy: Secondary | ICD-10-CM | POA: Diagnosis not present

## 2019-01-20 DIAGNOSIS — R768 Other specified abnormal immunological findings in serum: Secondary | ICD-10-CM | POA: Diagnosis not present

## 2019-01-21 ENCOUNTER — Telehealth: Payer: Self-pay | Admitting: Neurology

## 2019-01-21 DIAGNOSIS — N3000 Acute cystitis without hematuria: Secondary | ICD-10-CM | POA: Diagnosis not present

## 2019-01-21 DIAGNOSIS — M329 Systemic lupus erythematosus, unspecified: Secondary | ICD-10-CM | POA: Diagnosis not present

## 2019-01-21 DIAGNOSIS — K210 Gastro-esophageal reflux disease with esophagitis: Secondary | ICD-10-CM | POA: Diagnosis not present

## 2019-01-21 DIAGNOSIS — R5383 Other fatigue: Secondary | ICD-10-CM | POA: Diagnosis not present

## 2019-01-21 DIAGNOSIS — E1143 Type 2 diabetes mellitus with diabetic autonomic (poly)neuropathy: Secondary | ICD-10-CM | POA: Diagnosis not present

## 2019-01-21 DIAGNOSIS — Z6828 Body mass index (BMI) 28.0-28.9, adult: Secondary | ICD-10-CM | POA: Diagnosis not present

## 2019-01-21 DIAGNOSIS — M069 Rheumatoid arthritis, unspecified: Secondary | ICD-10-CM | POA: Diagnosis not present

## 2019-01-21 NOTE — Telephone Encounter (Signed)
I called the patient.  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

## 2019-02-05 ENCOUNTER — Encounter (INDEPENDENT_AMBULATORY_CARE_PROVIDER_SITE_OTHER): Payer: Medicare Other | Admitting: Ophthalmology

## 2019-03-24 DIAGNOSIS — R2231 Localized swelling, mass and lump, right upper limb: Secondary | ICD-10-CM | POA: Diagnosis not present

## 2019-03-24 DIAGNOSIS — R197 Diarrhea, unspecified: Secondary | ICD-10-CM | POA: Diagnosis not present

## 2019-03-24 DIAGNOSIS — R5383 Other fatigue: Secondary | ICD-10-CM | POA: Diagnosis not present

## 2019-03-24 DIAGNOSIS — M3500 Sicca syndrome, unspecified: Secondary | ICD-10-CM | POA: Diagnosis not present

## 2019-03-24 DIAGNOSIS — E663 Overweight: Secondary | ICD-10-CM | POA: Diagnosis not present

## 2019-03-24 DIAGNOSIS — M1990 Unspecified osteoarthritis, unspecified site: Secondary | ICD-10-CM | POA: Diagnosis not present

## 2019-03-24 DIAGNOSIS — Z6829 Body mass index (BMI) 29.0-29.9, adult: Secondary | ICD-10-CM | POA: Diagnosis not present

## 2019-03-24 DIAGNOSIS — F419 Anxiety disorder, unspecified: Secondary | ICD-10-CM | POA: Diagnosis not present

## 2019-03-24 DIAGNOSIS — D696 Thrombocytopenia, unspecified: Secondary | ICD-10-CM | POA: Diagnosis not present

## 2019-03-24 DIAGNOSIS — M0579 Rheumatoid arthritis with rheumatoid factor of multiple sites without organ or systems involvement: Secondary | ICD-10-CM | POA: Diagnosis not present

## 2019-03-24 DIAGNOSIS — K120 Recurrent oral aphthae: Secondary | ICD-10-CM | POA: Diagnosis not present

## 2019-04-01 DIAGNOSIS — M0579 Rheumatoid arthritis with rheumatoid factor of multiple sites without organ or systems involvement: Secondary | ICD-10-CM | POA: Diagnosis not present

## 2019-04-09 ENCOUNTER — Encounter (INDEPENDENT_AMBULATORY_CARE_PROVIDER_SITE_OTHER): Payer: Medicare Other | Admitting: Ophthalmology

## 2019-04-09 DIAGNOSIS — H4423 Degenerative myopia, bilateral: Secondary | ICD-10-CM

## 2019-04-09 DIAGNOSIS — I10 Essential (primary) hypertension: Secondary | ICD-10-CM

## 2019-04-09 DIAGNOSIS — H35033 Hypertensive retinopathy, bilateral: Secondary | ICD-10-CM | POA: Diagnosis not present

## 2019-04-09 DIAGNOSIS — H43813 Vitreous degeneration, bilateral: Secondary | ICD-10-CM

## 2019-04-15 DIAGNOSIS — M0579 Rheumatoid arthritis with rheumatoid factor of multiple sites without organ or systems involvement: Secondary | ICD-10-CM | POA: Diagnosis not present

## 2019-04-16 DIAGNOSIS — I10 Essential (primary) hypertension: Secondary | ICD-10-CM | POA: Diagnosis not present

## 2019-04-16 DIAGNOSIS — E1165 Type 2 diabetes mellitus with hyperglycemia: Secondary | ICD-10-CM | POA: Diagnosis not present

## 2019-04-16 DIAGNOSIS — E1143 Type 2 diabetes mellitus with diabetic autonomic (poly)neuropathy: Secondary | ICD-10-CM | POA: Diagnosis not present

## 2019-04-16 DIAGNOSIS — R748 Abnormal levels of other serum enzymes: Secondary | ICD-10-CM | POA: Diagnosis not present

## 2019-04-16 DIAGNOSIS — F411 Generalized anxiety disorder: Secondary | ICD-10-CM | POA: Diagnosis not present

## 2019-04-16 DIAGNOSIS — E782 Mixed hyperlipidemia: Secondary | ICD-10-CM | POA: Diagnosis not present

## 2019-04-16 DIAGNOSIS — B961 Klebsiella pneumoniae [K. pneumoniae] as the cause of diseases classified elsewhere: Secondary | ICD-10-CM | POA: Diagnosis not present

## 2019-04-16 DIAGNOSIS — M3210 Systemic lupus erythematosus, organ or system involvement unspecified: Secondary | ICD-10-CM | POA: Diagnosis not present

## 2019-04-18 DIAGNOSIS — Z6829 Body mass index (BMI) 29.0-29.9, adult: Secondary | ICD-10-CM | POA: Diagnosis not present

## 2019-04-18 DIAGNOSIS — I10 Essential (primary) hypertension: Secondary | ICD-10-CM | POA: Diagnosis not present

## 2019-04-18 DIAGNOSIS — Z23 Encounter for immunization: Secondary | ICD-10-CM | POA: Diagnosis not present

## 2019-04-18 DIAGNOSIS — E1165 Type 2 diabetes mellitus with hyperglycemia: Secondary | ICD-10-CM | POA: Diagnosis not present

## 2019-04-18 DIAGNOSIS — E1143 Type 2 diabetes mellitus with diabetic autonomic (poly)neuropathy: Secondary | ICD-10-CM | POA: Diagnosis not present

## 2019-04-18 DIAGNOSIS — D649 Anemia, unspecified: Secondary | ICD-10-CM | POA: Diagnosis not present

## 2019-04-18 DIAGNOSIS — M069 Rheumatoid arthritis, unspecified: Secondary | ICD-10-CM | POA: Diagnosis not present

## 2019-04-18 DIAGNOSIS — D72819 Decreased white blood cell count, unspecified: Secondary | ICD-10-CM | POA: Diagnosis not present

## 2019-04-22 DIAGNOSIS — R110 Nausea: Secondary | ICD-10-CM | POA: Diagnosis not present

## 2019-04-22 DIAGNOSIS — R1084 Generalized abdominal pain: Secondary | ICD-10-CM | POA: Diagnosis not present

## 2019-04-22 DIAGNOSIS — K769 Liver disease, unspecified: Secondary | ICD-10-CM | POA: Diagnosis not present

## 2019-05-09 DIAGNOSIS — D1809 Hemangioma of other sites: Secondary | ICD-10-CM | POA: Diagnosis not present

## 2019-05-09 DIAGNOSIS — R1011 Right upper quadrant pain: Secondary | ICD-10-CM | POA: Diagnosis not present

## 2019-05-09 DIAGNOSIS — D1803 Hemangioma of intra-abdominal structures: Secondary | ICD-10-CM | POA: Diagnosis not present

## 2019-05-09 DIAGNOSIS — K769 Liver disease, unspecified: Secondary | ICD-10-CM | POA: Diagnosis not present

## 2019-05-13 DIAGNOSIS — M0579 Rheumatoid arthritis with rheumatoid factor of multiple sites without organ or systems involvement: Secondary | ICD-10-CM | POA: Diagnosis not present

## 2019-07-04 DIAGNOSIS — Z1231 Encounter for screening mammogram for malignant neoplasm of breast: Secondary | ICD-10-CM | POA: Diagnosis not present

## 2019-07-08 DIAGNOSIS — M0579 Rheumatoid arthritis with rheumatoid factor of multiple sites without organ or systems involvement: Secondary | ICD-10-CM | POA: Diagnosis not present

## 2019-07-09 DIAGNOSIS — M545 Low back pain: Secondary | ICD-10-CM | POA: Diagnosis not present

## 2019-07-09 DIAGNOSIS — Z6829 Body mass index (BMI) 29.0-29.9, adult: Secondary | ICD-10-CM | POA: Diagnosis not present

## 2019-08-08 DIAGNOSIS — I10 Essential (primary) hypertension: Secondary | ICD-10-CM | POA: Diagnosis not present

## 2019-08-08 DIAGNOSIS — E7849 Other hyperlipidemia: Secondary | ICD-10-CM | POA: Diagnosis not present

## 2019-08-12 DIAGNOSIS — E1165 Type 2 diabetes mellitus with hyperglycemia: Secondary | ICD-10-CM | POA: Diagnosis not present

## 2019-08-12 DIAGNOSIS — M3210 Systemic lupus erythematosus, organ or system involvement unspecified: Secondary | ICD-10-CM | POA: Diagnosis not present

## 2019-08-12 DIAGNOSIS — E781 Pure hyperglyceridemia: Secondary | ICD-10-CM | POA: Diagnosis not present

## 2019-08-12 DIAGNOSIS — E559 Vitamin D deficiency, unspecified: Secondary | ICD-10-CM | POA: Diagnosis not present

## 2019-08-12 DIAGNOSIS — I10 Essential (primary) hypertension: Secondary | ICD-10-CM | POA: Diagnosis not present

## 2019-08-12 DIAGNOSIS — E1143 Type 2 diabetes mellitus with diabetic autonomic (poly)neuropathy: Secondary | ICD-10-CM | POA: Diagnosis not present

## 2019-08-12 DIAGNOSIS — K2100 Gastro-esophageal reflux disease with esophagitis, without bleeding: Secondary | ICD-10-CM | POA: Diagnosis not present

## 2019-08-12 DIAGNOSIS — R5383 Other fatigue: Secondary | ICD-10-CM | POA: Diagnosis not present

## 2019-08-18 DIAGNOSIS — R5383 Other fatigue: Secondary | ICD-10-CM | POA: Diagnosis not present

## 2019-08-18 DIAGNOSIS — D72819 Decreased white blood cell count, unspecified: Secondary | ICD-10-CM | POA: Diagnosis not present

## 2019-08-18 DIAGNOSIS — M3210 Systemic lupus erythematosus, organ or system involvement unspecified: Secondary | ICD-10-CM | POA: Diagnosis not present

## 2019-08-18 DIAGNOSIS — E1143 Type 2 diabetes mellitus with diabetic autonomic (poly)neuropathy: Secondary | ICD-10-CM | POA: Diagnosis not present

## 2019-08-18 DIAGNOSIS — I10 Essential (primary) hypertension: Secondary | ICD-10-CM | POA: Diagnosis not present

## 2019-08-18 DIAGNOSIS — D649 Anemia, unspecified: Secondary | ICD-10-CM | POA: Diagnosis not present

## 2019-08-18 DIAGNOSIS — M069 Rheumatoid arthritis, unspecified: Secondary | ICD-10-CM | POA: Diagnosis not present

## 2019-08-18 DIAGNOSIS — E1165 Type 2 diabetes mellitus with hyperglycemia: Secondary | ICD-10-CM | POA: Diagnosis not present

## 2019-09-02 DIAGNOSIS — M0579 Rheumatoid arthritis with rheumatoid factor of multiple sites without organ or systems involvement: Secondary | ICD-10-CM | POA: Diagnosis not present

## 2019-09-10 DIAGNOSIS — E7849 Other hyperlipidemia: Secondary | ICD-10-CM | POA: Diagnosis not present

## 2019-09-10 DIAGNOSIS — E1165 Type 2 diabetes mellitus with hyperglycemia: Secondary | ICD-10-CM | POA: Diagnosis not present

## 2019-09-17 DIAGNOSIS — R0600 Dyspnea, unspecified: Secondary | ICD-10-CM | POA: Diagnosis not present

## 2019-09-17 DIAGNOSIS — Z7952 Long term (current) use of systemic steroids: Secondary | ICD-10-CM | POA: Diagnosis not present

## 2019-09-17 DIAGNOSIS — D696 Thrombocytopenia, unspecified: Secondary | ICD-10-CM | POA: Diagnosis not present

## 2019-09-17 DIAGNOSIS — R768 Other specified abnormal immunological findings in serum: Secondary | ICD-10-CM | POA: Diagnosis not present

## 2019-09-17 DIAGNOSIS — L659 Nonscarring hair loss, unspecified: Secondary | ICD-10-CM | POA: Diagnosis not present

## 2019-09-17 DIAGNOSIS — E663 Overweight: Secondary | ICD-10-CM | POA: Diagnosis not present

## 2019-09-17 DIAGNOSIS — M3500 Sicca syndrome, unspecified: Secondary | ICD-10-CM | POA: Diagnosis not present

## 2019-09-17 DIAGNOSIS — H538 Other visual disturbances: Secondary | ICD-10-CM | POA: Diagnosis not present

## 2019-09-17 DIAGNOSIS — R197 Diarrhea, unspecified: Secondary | ICD-10-CM | POA: Diagnosis not present

## 2019-09-17 DIAGNOSIS — M0579 Rheumatoid arthritis with rheumatoid factor of multiple sites without organ or systems involvement: Secondary | ICD-10-CM | POA: Diagnosis not present

## 2019-09-17 DIAGNOSIS — M1990 Unspecified osteoarthritis, unspecified site: Secondary | ICD-10-CM | POA: Diagnosis not present

## 2019-09-22 DIAGNOSIS — E1165 Type 2 diabetes mellitus with hyperglycemia: Secondary | ICD-10-CM | POA: Diagnosis not present

## 2019-09-22 DIAGNOSIS — E7849 Other hyperlipidemia: Secondary | ICD-10-CM | POA: Diagnosis not present

## 2019-09-24 DIAGNOSIS — K2100 Gastro-esophageal reflux disease with esophagitis, without bleeding: Secondary | ICD-10-CM | POA: Diagnosis not present

## 2019-09-24 DIAGNOSIS — R197 Diarrhea, unspecified: Secondary | ICD-10-CM | POA: Diagnosis not present

## 2019-09-24 DIAGNOSIS — K2970 Gastritis, unspecified, without bleeding: Secondary | ICD-10-CM | POA: Diagnosis not present

## 2019-10-15 DIAGNOSIS — M0579 Rheumatoid arthritis with rheumatoid factor of multiple sites without organ or systems involvement: Secondary | ICD-10-CM | POA: Diagnosis not present

## 2019-11-10 DIAGNOSIS — E7849 Other hyperlipidemia: Secondary | ICD-10-CM | POA: Diagnosis not present

## 2019-11-10 DIAGNOSIS — I10 Essential (primary) hypertension: Secondary | ICD-10-CM | POA: Diagnosis not present

## 2019-11-10 DIAGNOSIS — E1165 Type 2 diabetes mellitus with hyperglycemia: Secondary | ICD-10-CM | POA: Diagnosis not present

## 2019-11-10 DIAGNOSIS — M3210 Systemic lupus erythematosus, organ or system involvement unspecified: Secondary | ICD-10-CM | POA: Diagnosis not present

## 2019-11-10 DIAGNOSIS — Z7984 Long term (current) use of oral hypoglycemic drugs: Secondary | ICD-10-CM | POA: Diagnosis not present

## 2019-11-13 ENCOUNTER — Encounter (INDEPENDENT_AMBULATORY_CARE_PROVIDER_SITE_OTHER): Payer: Self-pay | Admitting: Gastroenterology

## 2019-11-17 DIAGNOSIS — M0579 Rheumatoid arthritis with rheumatoid factor of multiple sites without organ or systems involvement: Secondary | ICD-10-CM | POA: Diagnosis not present

## 2019-12-22 ENCOUNTER — Telehealth (INDEPENDENT_AMBULATORY_CARE_PROVIDER_SITE_OTHER): Payer: Self-pay | Admitting: *Deleted

## 2019-12-22 ENCOUNTER — Encounter (INDEPENDENT_AMBULATORY_CARE_PROVIDER_SITE_OTHER): Payer: Self-pay | Admitting: *Deleted

## 2019-12-22 ENCOUNTER — Ambulatory Visit (INDEPENDENT_AMBULATORY_CARE_PROVIDER_SITE_OTHER): Payer: Medicare Other | Admitting: Gastroenterology

## 2019-12-22 ENCOUNTER — Other Ambulatory Visit (INDEPENDENT_AMBULATORY_CARE_PROVIDER_SITE_OTHER): Payer: Self-pay | Admitting: *Deleted

## 2019-12-22 ENCOUNTER — Encounter (INDEPENDENT_AMBULATORY_CARE_PROVIDER_SITE_OTHER): Payer: Self-pay | Admitting: Gastroenterology

## 2019-12-22 ENCOUNTER — Other Ambulatory Visit: Payer: Self-pay

## 2019-12-22 DIAGNOSIS — R1319 Other dysphagia: Secondary | ICD-10-CM

## 2019-12-22 DIAGNOSIS — K769 Liver disease, unspecified: Secondary | ICD-10-CM | POA: Diagnosis not present

## 2019-12-22 DIAGNOSIS — R1310 Dysphagia, unspecified: Secondary | ICD-10-CM

## 2019-12-22 DIAGNOSIS — K529 Noninfective gastroenteritis and colitis, unspecified: Secondary | ICD-10-CM | POA: Diagnosis not present

## 2019-12-22 MED ORDER — PLENVU 140 G PO SOLR
1.0000 | Freq: Once | ORAL | 0 refills | Status: AC
Start: 2019-12-22 — End: 2019-12-22

## 2019-12-22 NOTE — Patient Instructions (Addendum)
Schedule MRI abdomen with and w/o IV con Schedule EGD and colonoscopy with esophageal/SB biopsies and random colon Bx Schedule barium esophagram with pill Patient was counseled about the benefit of implementing a low FODMAP to improve symptoms and recurrent episodes. A dietary list was provided to the patient. Also, the patient was counseled about the benefit of avoiding stressing situations and potential environmental triggers leading to symptomatology. Take Imodium as needed for diarrhea

## 2019-12-22 NOTE — Telephone Encounter (Signed)
Patient needs Plenvu (copay card) ° °

## 2019-12-22 NOTE — Progress Notes (Signed)
[name], M.D. Gastroenterology & Hepatology [facility] [address] [place], [place] [address]  [place] [address]  Referring MD: Darlene Masse, MD  I will communicate my assessment and recommendations to the referring MD via EMR. Note: Occasional unusual wording and randomly placed punctuation marks may result from the use of speech recognition technology to transcribe this document"  Chief Complaint: Abdominal pain, dysphagia and watery diarrhea  History of Present Illness: [name] is a 69 y.o. female with past medical history of lupus,hld, rheumatoid arthritis on Tocilizumab, hypertension, diabetes, IBS, who presents for evaluation of abdominal pain, dysphagia and watery diarrhea.  Patient reports that for last to 3 years she noticed new onset of loose to watery bowel movements, usually 3-4 bowel movements per day, along with urgency and tenesmus.  The patient denies any fresh blood in her stool but states that has caused significant discomfort as she has had a few accidents in the past.  She has taken Pepto-Bismol to try to improve her symptoms but this has not completely controlled her episodes.  She has also tried Imodium but she reports it makes her too constipated.  She is states that the diarrhea is more common when the patient has eating processed foods or salads.  Notably, the patient is on Prozac.  Patient also reports that she has presented mild abdominal pain in her left side of the abdomen since the fall 2020, which she describes as a pressure whenever she bends over or when she presses her abdomen.  It has been on and off throughout the time.  He has decreasing severity recently.  She also reports dysphagia to solid foods, usually when the consistency of the food is very dry.  She describes it as the food takes longer to go down than usual, usually 5 to 10 minutes.  She takes small sips of water to help  it go down but does not drink larger amounts as she is fearful that it may cause her choking episodes.  However, she has never had a choking event and denies inducing her vomiting to remove the food from her chest.  She states that her dysphagia has been going on for the last year.  She states that her PCP started her on pantoprazole 40 mg twice a day, which she believes has helped improve slightly her symptoms.  Patient was ordered an ultrasound abdomen which she performed on 04/22/2019.  Was found to have 1.3 x 1.7 x 1.7 cm and 1.8 x 1.1 x 1.5 cm lesions in the right lobe of the liver suggestive of hemangiomas.  Also, there was presence of 7.1 x 6.2 x 3.5 cm heterogeneous area in the anterior liver suggestive of possible fatty liver but there is some scalloping overlying the liver capsule for which an MRI without and with IV contrast is recommended.  The patient denies having any nausea, vomiting, fever, chills, hematochezia, melena, hematemesis, abdominal distention, jaundice, pruritus or weight loss.  Last FAO:ZHYQM Last Colonoscopy:5 years agfo at [facility], no polyps per patient  FHx: neg for any gastrointestinal/liver disease, no malignancies Social: Former smoker until age 30, neg alcohol or illicit drug use Surgical: Cholecystectomy and hysterectomy.  Past Medical History: Past Medical History:  Diagnosis Date  . Basal cell carcinoma   . 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 Diabetes ([place])   . IBS (irritable bowel syndrome)   . Lupus (  [place])   . Migraine headache 09/11/2018  . Neuropathy     Past Surgical History: Past Surgical History:  Procedure Laterality Date  . BIOPSY BREAST Right    negative  . GALLBLADDER SURGERY    . TENDON EXPLORATION     arms/hand    Family History: Family History  Problem Relation Age of Onset  . Cerebral aneurysm Mother   . High blood pressure Mother   . Thyroid disease Mother   . COPD Father      Social History: Social History   Tobacco Use  Smoking Status Former Smoker  Smokeless Tobacco Never Used   Social History   Substance and Sexual Activity  Alcohol Use No   Social History   Substance and Sexual Activity  Drug Use No    Allergies: Allergies  Allergen Reactions  . Clindamycin/Lincomycin Hives  . Sulfa Antibiotics Nausea And Vomiting    Medications: Current Outpatient Medications  Medication Sig Dispense Refill  . Cholecalciferol (VITAMIN D PO) Take 2,000 Units by mouth daily. With calcium    . FLUoxetine (PROZAC) 20 MG capsule Take 20 mg by mouth daily.  2  . lisinopril-hydrochlorothiazide (PRINZIDE,ZESTORETIC) 10-12.5 MG tablet Take 1 tablet by mouth daily.    . pioglitazone (ACTOS) 15 MG tablet Take 15 mg by mouth daily.  6  . SHINGRIX injection Inject 0.5 mLs into the muscle once.     . topiramate (TOPAMAX) 25 MG tablet Take 3 tablets (75 mg total) by mouth at bedtime. 270 tablet 3  . zolpidem (AMBIEN) 10 MG tablet Take 10 mg by mouth at bedtime as needed for sleep.     No current facility-administered medications for this visit.    Review of Systems: GENERAL: negative for malaise, significant weight loss, night sweats and fever HEENT: No changes in hearing or vision, no nose bleeds or other nasal problems. No trouble swallowing NECK: Negative for lumps, goiter, pain and significant neck swelling RESPIRATORY: Negative for cough, wheezing and shortness of breath CARDIOVASCULAR: Negative for chest pain, leg swelling, palpitations, orthopnea GI: SEE HPI MUSCULOSKELETAL: Negative for joint pain or swelling, back pain, and muscle pain. SKIN: Negative for lesions, rash, and itching. PSYCH: Negative for sleep disturbance, mood disorder and recent psychosocial stressors. HEMATOLOGY Negative for prolonged bleeding, bruising easily, and swollen nodes. ENDOCRINE: Negative for cold or heat intolerance, polyuria, polydipsia and goiter. NEURO: negative for  lightheadedness, dizziness, tremor, gait imbalance, syncope and seizures. The remainder of the review of systems is noncontributory.   Physical Exam: BP (!) 147/88 (BP Location: Right Arm, Patient Position: Sitting, Cuff Size: Normal)   Pulse 75   Temp 97.9 F (36.6 C) (Oral)   Ht 5\' 7"  (1.702 m)   Wt 180 lb 11.2 oz (82 kg)   BMI 28.30 kg/m  GENERAL: The patient is AO x3, in no acute distress. HEENT: Head is normocephalic and atraumatic. EOMI are intact. Mouth is well hydrated and without lesions. NECK: Supple. No masses LUNGS: Clear to auscultation. No presence of rhonchi/wheezing/rales. Adequate chest expansion HEART: RRR, normal s1 and s2. ABDOMEN: midlly tender to palpation in L side of abdomen worse in LUQ, no guarding, no peritoneal signs, and nondistended. BS +. No masses. EXTREMITIES: Without any cyanosis, clubbing, rash, lesions or edema. NEUROLOGIC: AOx3, no focal motor deficit. SKIN: no jaundice, no rashes   Imaging/Labs: as above  I personally reviewed and interpreted the available labs, imaging and endoscopic files.  Impression and Plan: [name] is a 69 y.o. female with  past medical history of lupus,hld, rheumatoid arthritis on Tocilizumab, hypertension, diabetes, IBS, who presents for evaluation of abdominal pain, dysphagia and watery diarrhea.  Regarding her dysphagia, the patient has presented progressive symptoms of unclear etiology, which could be related to peptic stricture versus esophageal dysmotility, less likely related to eosinophilic esophagitis or malignancy.  We will proceed with an EGD with possible dilation and esophageal biopsies for EOE.  Also, an esophagram with pill will be ordered for comprehensive evaluation.  Regarding her diarrhea, the patient has been taking Prozac and pantoprazole which can potentially increase her symptoms, she was advised to decrease the pantoprazole to once a day and Prozac will need to be discussed with the primary  prescriber to determine any other possible options that do not cause diarrhea.  It is possible that she has IBS-D for which she will benefit from implementing a low FODMAP diet.  Nevertheless, we will proceed with random colon biopsies I will obtain small bowel biopsies as well to evaluate microscopic colitis and celiac disease.  She can take Imodium as needed for diarrhea.  Finally, the patient will need to undergo an MRI of the abdomen with and without IV contrast to evaluate the previously seen liver lesions.  More than 50% of the office visit was dedicated to discussing the procedure, including the day of and risks involved. Patient understands what the procedure involves including the benefits and any risks. Patient understands alternatives to the proposed procedure. Risks including (but not limited to) bleeding, tearing of the lining (perforation), rupture of adjacent organs, problems with heart and lung function, infection, and medication reactions. A small percentage of complications may require surgery, hospitalization, repeat endoscopic procedure, and/or transfusion. A small percentage of polyps and other tumors may not be seen.  - Schedule MRI abdomen with and w/o IV con - Schedule EGD and colonoscopy with esophageal/SB biopsies and random colon Bx - Schedule barium esophagram with pill - Patient was counseled about the benefit of implementing a low FODMAP to improve symptoms and recurrent episodes. A dietary list was provided to the patient. Also, the patient was counseled about the benefit of avoiding stressing situations and potential environmental triggers leading to symptomatology. - Take Imodium as needed for diarrhea  - RTC 3 months  All questions were answered.      Harvel Quale, MD Gastroenterology and Hepatology [facility]

## 2019-12-23 DIAGNOSIS — I10 Essential (primary) hypertension: Secondary | ICD-10-CM | POA: Diagnosis not present

## 2019-12-23 DIAGNOSIS — D649 Anemia, unspecified: Secondary | ICD-10-CM | POA: Diagnosis not present

## 2019-12-23 DIAGNOSIS — M069 Rheumatoid arthritis, unspecified: Secondary | ICD-10-CM | POA: Diagnosis not present

## 2019-12-23 DIAGNOSIS — E1143 Type 2 diabetes mellitus with diabetic autonomic (poly)neuropathy: Secondary | ICD-10-CM | POA: Diagnosis not present

## 2019-12-23 DIAGNOSIS — E1165 Type 2 diabetes mellitus with hyperglycemia: Secondary | ICD-10-CM | POA: Diagnosis not present

## 2019-12-23 DIAGNOSIS — M329 Systemic lupus erythematosus, unspecified: Secondary | ICD-10-CM | POA: Diagnosis not present

## 2019-12-23 DIAGNOSIS — E559 Vitamin D deficiency, unspecified: Secondary | ICD-10-CM | POA: Diagnosis not present

## 2019-12-23 DIAGNOSIS — E782 Mixed hyperlipidemia: Secondary | ICD-10-CM | POA: Diagnosis not present

## 2019-12-24 ENCOUNTER — Other Ambulatory Visit (INDEPENDENT_AMBULATORY_CARE_PROVIDER_SITE_OTHER): Payer: Self-pay | Admitting: Gastroenterology

## 2019-12-24 ENCOUNTER — Other Ambulatory Visit: Payer: Self-pay

## 2019-12-24 ENCOUNTER — Telehealth (INDEPENDENT_AMBULATORY_CARE_PROVIDER_SITE_OTHER): Payer: Self-pay | Admitting: Gastroenterology

## 2019-12-24 ENCOUNTER — Ambulatory Visit (HOSPITAL_COMMUNITY)
Admission: RE | Admit: 2019-12-24 | Discharge: 2019-12-24 | Disposition: A | Discharge status: Home / Self Care | Payer: Medicare Other | Source: Ambulatory Visit | Attending: Gastroenterology | Admitting: Gastroenterology

## 2019-12-24 DIAGNOSIS — R1310 Dysphagia, unspecified: Secondary | ICD-10-CM | POA: Insufficient documentation

## 2019-12-24 DIAGNOSIS — R1319 Other dysphagia: Secondary | ICD-10-CM

## 2019-12-24 NOTE — Telephone Encounter (Signed)
Called patient to discuss results of esophagram which was within normal limits.  Patient understood and agreed. Will proceed with the scheduled EGD with esophageal biopsies.   Harvel Quale, MD Gastroenterology and Hepatology [facility]

## 2019-12-25 DIAGNOSIS — M3210 Systemic lupus erythematosus, organ or system involvement unspecified: Secondary | ICD-10-CM | POA: Diagnosis not present

## 2019-12-25 DIAGNOSIS — Z6828 Body mass index (BMI) 28.0-28.9, adult: Secondary | ICD-10-CM | POA: Diagnosis not present

## 2019-12-25 DIAGNOSIS — Z1389 Encounter for screening for other disorder: Secondary | ICD-10-CM | POA: Diagnosis not present

## 2019-12-25 DIAGNOSIS — E1143 Type 2 diabetes mellitus with diabetic autonomic (poly)neuropathy: Secondary | ICD-10-CM | POA: Diagnosis not present

## 2019-12-25 DIAGNOSIS — I10 Essential (primary) hypertension: Secondary | ICD-10-CM | POA: Diagnosis not present

## 2019-12-25 DIAGNOSIS — E1165 Type 2 diabetes mellitus with hyperglycemia: Secondary | ICD-10-CM | POA: Diagnosis not present

## 2019-12-25 DIAGNOSIS — D72819 Decreased white blood cell count, unspecified: Secondary | ICD-10-CM | POA: Diagnosis not present

## 2019-12-25 DIAGNOSIS — Z1331 Encounter for screening for depression: Secondary | ICD-10-CM | POA: Diagnosis not present

## 2020-01-02 NOTE — Patient Instructions (Signed)
KESTREL MIS  01/02/2020     @PREFPERIOPPHARMACY @   Your procedure is scheduled on  01/09/2020 .  Report to [facility] at  0900  A.M.  Call this number if you have problems the morning of surgery:  [phone]   Remember:  Follow the diet and prep instructions given to you by Dr Colman Cater office.                      Take these medicines the morning of surgery with A SIP OF WATER   Prozac, protonix, deltasone.    Do not wear jewelry, make-up or nail polish.  Do not wear lotions, powders, or perfumes. Please brush your teeth and wear deodorant.  Do not shave 48 hours prior to surgery.  Men may shave face and neck.  Do not bring valuables to the hospital.  [facility] is not responsible for any belongings or valuables.  Contacts, dentures or bridgework may not be worn into surgery.  Leave your suitcase in the car.  After surgery it may be brought to your room.  For patients admitted to the hospital, discharge time will be determined by your treatment team.  Patients discharged the day of surgery will not be allowed to drive home.   Name and phone number of your driver:   family Special instructions:  DO NOT smoke the morning of your procedure.  Please read over the following fact sheets that you were given. Anesthesia Post-op Instructions and Care and Recovery After Surgery       Upper Endoscopy, Adult, Care After This sheet gives you information about how to care for yourself after your procedure. Your health care provider may also give you more specific instructions. If you have problems or questions, contact your health care provider. What can I expect after the procedure? After the procedure, it is common to have:  A sore throat.  Mild stomach pain or discomfort.  Bloating.  Nausea. Follow these instructions at home:   Follow instructions from your health care provider about what to eat or drink after your procedure.  Return to your  normal activities as told by your health care provider. Ask your health care provider what activities are safe for you.  Take over-the-counter and prescription medicines only as told by your health care provider.  Do not drive for 24 hours if you were given a sedative during your procedure.  Keep all follow-up visits as told by your health care provider. This is important. Contact a health care provider if you have:  A sore throat that lasts longer than one day.  Trouble swallowing. Get help right away if:  You vomit blood or your vomit looks like coffee grounds.  You have: ? A fever. ? Bloody, black, or tarry stools. ? A severe sore throat or you cannot swallow. ? Difficulty breathing. ? Severe pain in your chest or abdomen. Summary  After the procedure, it is common to have a sore throat, mild stomach discomfort, bloating, and nausea.  Do not drive for 24 hours if you were given a sedative during the procedure.  Follow instructions from your health care provider about what to eat or drink after your procedure.  Return to your normal activities as told by your health care provider. This information is not intended to replace advice given to you by your health care provider. Make sure you discuss any questions you have with  your health care provider. Document Revised: 11/20/2017 Document Reviewed: 10/29/2017 Elsevier Patient Education  [place].  Esophageal Dilatation Esophageal dilatation, also called esophageal dilation, is a procedure to widen or open (dilate) a blocked or narrowed part of the esophagus. The esophagus is the part of the body that moves food and liquid from the mouth to the stomach. You may need this procedure if:  You have a buildup of scar tissue in your esophagus that makes it difficult, painful, or impossible to swallow. This can be caused by gastroesophageal reflux disease (GERD).  You have cancer of the esophagus.  There is a problem with  how food moves through your esophagus. In some cases, you may need this procedure repeated at a later time to dilate the esophagus gradually. Tell a health care provider about:  Any allergies you have.  All medicines you are taking, including vitamins, herbs, eye drops, creams, and over-the-counter medicines.  Any problems you or family members have had with anesthetic medicines.  Any blood disorders you have.  Any surgeries you have had.  Any medical conditions you have.  Any antibiotic medicines you are required to take before dental procedures.  Whether you are pregnant or may be pregnant. What are the risks? Generally, this is a safe procedure. However, problems may occur, including:  Bleeding due to a tear in the lining of the esophagus.  A hole (perforation) in the esophagus. What happens before the procedure?  Follow instructions from your health care provider about eating or drinking restrictions.  Ask your health care provider about changing or stopping your regular medicines. This is especially important if you are taking diabetes medicines or blood thinners.  Plan to have someone take you home from the hospital or clinic.  Plan to have a responsible adult care for you for at least 24 hours after you leave the hospital or clinic. This is important. What happens during the procedure?  You may be given a medicine to help you relax (sedative).  A numbing medicine may be sprayed into the back of your throat, or you may gargle the medicine.  Your health care provider may perform the dilatation using various surgical instruments, such as: ? Simple dilators. This instrument is carefully placed in the esophagus to stretch it. ? Guided wire bougies. This involves using an endoscope to insert a wire into the esophagus. A dilator is passed over this wire to enlarge the esophagus. Then the wire is removed. ? Balloon dilators. An endoscope with a small balloon at the end is  inserted into the esophagus. The balloon is inflated to stretch the esophagus and open it up. The procedure may vary among health care providers and hospitals. What happens after the procedure?  Your blood pressure, heart rate, breathing rate, and blood oxygen level will be monitored until the medicines you were given have worn off.  Your throat may feel slightly sore and numb. This will improve slowly over time.  You will not be allowed to eat or drink until your throat is no longer numb.  When you are able to drink, urinate, and sit on the edge of the bed without nausea or dizziness, you may be able to return home. Follow these instructions at home:  Take over-the-counter and prescription medicines only as told by your health care provider.  Do not drive for 24 hours if you were given a sedative during your procedure.  You should have a responsible adult with you for 24 hours after  the procedure.  Follow instructions from your health care provider about any eating or drinking restrictions.  Do not use any products that contain nicotine or tobacco, such as cigarettes and e-cigarettes. If you need help quitting, ask your health care provider.  Keep all follow-up visits as told by your health care provider. This is important. Get help right away if you:  Have a fever.  Have chest pain.  Have pain that is not relieved by medication.  Have trouble breathing.  Have trouble swallowing.  Vomit blood. Summary  Esophageal dilatation, also called esophageal dilation, is a procedure to widen or open (dilate) a blocked or narrowed part of the esophagus.  Plan to have someone take you home from the hospital or clinic.  For this procedure, a numbing medicine may be sprayed into the back of your throat, or you may gargle the medicine.  Do not drive for 24 hours if you were given a sedative during your procedure. This information is not intended to replace advice given to you by your  health care provider. Make sure you discuss any questions you have with your health care provider. Document Revised: 03/26/2019 Document Reviewed: 04/03/2017 Elsevier Patient Education  [place].  Colonoscopy, Adult, Care After This sheet gives you information about how to care for yourself after your procedure. Your health care provider may also give you more specific instructions. If you have problems or questions, contact your health care provider. What can I expect after the procedure? After the procedure, it is common to have:  A small amount of blood in your stool for 24 hours after the procedure.  Some gas.  Mild cramping or bloating of your abdomen. Follow these instructions at home: Eating and drinking   Drink enough fluid to keep your urine pale yellow.  Follow instructions from your health care provider about eating or drinking restrictions.  Resume your normal diet as instructed by your health care provider. Avoid heavy or fried foods that are hard to digest. Activity  Rest as told by your health care provider.  Avoid sitting for a long time without moving. Get up to take short walks every 1-2 hours. This is important to improve blood flow and breathing. Ask for help if you feel weak or unsteady.  Return to your normal activities as told by your health care provider. Ask your health care provider what activities are safe for you. Managing cramping and bloating   Try walking around when you have cramps or feel bloated.  Apply heat to your abdomen as told by your health care provider. Use the heat source that your health care provider recommends, such as a moist heat pack or a heating pad. ? Place a towel between your skin and the heat source. ? Leave the heat on for 20-30 minutes. ? Remove the heat if your skin turns bright red. This is especially important if you are unable to feel pain, heat, or cold. You may have a greater risk of getting burned. General  instructions  For the first 24 hours after the procedure: ? Do not drive or use machinery. ? Do not sign important documents. ? Do not drink alcohol. ? Do your regular daily activities at a slower pace than normal. ? Eat soft foods that are easy to digest.  Take over-the-counter and prescription medicines only as told by your health care provider.  Keep all follow-up visits as told by your health care provider. This is important. Contact a health care  provider if:  You have blood in your stool 2-3 days after the procedure. Get help right away if you have:  More than a small spotting of blood in your stool.  Large blood clots in your stool.  Swelling of your abdomen.  Nausea or vomiting.  A fever.  Increasing pain in your abdomen that is not relieved with medicine. Summary  After the procedure, it is common to have a small amount of blood in your stool. You may also have mild cramping and bloating of your abdomen.  For the first 24 hours after the procedure, do not drive or use machinery, sign important documents, or drink alcohol.  Get help right away if you have a lot of blood in your stool, nausea or vomiting, a fever, or increased pain in your abdomen. This information is not intended to replace advice given to you by your health care provider. Make sure you discuss any questions you have with your health care provider. Document Revised: 12/23/2018 Document Reviewed: 12/23/2018 Elsevier Patient Education  [place] After These instructions provide you with information about caring for yourself after your procedure. Your health care provider may also give you more specific instructions. Your treatment has been planned according to current medical practices, but problems sometimes occur. Call your health care provider if you have any problems or questions after your procedure. What can I expect after the procedure? After your  procedure, you may:  Feel sleepy for several hours.  Feel clumsy and have poor balance for several hours.  Feel forgetful about what happened after the procedure.  Have poor judgment for several hours.  Feel nauseous or vomit.  Have a sore throat if you had a breathing tube during the procedure. Follow these instructions at home: For at least 24 hours after the procedure:      Have a responsible adult stay with you. It is important to have someone help care for you until you are awake and alert.  Rest as needed.  Do not: ? Participate in activities in which you could fall or become injured. ? Drive. ? Use heavy machinery. ? Drink alcohol. ? Take sleeping pills or medicines that cause drowsiness. ? Make important decisions or sign legal documents. ? Take care of children on your own. Eating and drinking  Follow the diet that is recommended by your health care provider.  If you vomit, drink water, juice, or soup when you can drink without vomiting.  Make sure you have little or no nausea before eating solid foods. General instructions  Take over-the-counter and prescription medicines only as told by your health care provider.  If you have sleep apnea, surgery and certain medicines can increase your risk for breathing problems. Follow instructions from your health care provider about wearing your sleep device: ? Anytime you are sleeping, including during daytime naps. ? While taking prescription pain medicines, sleeping medicines, or medicines that make you drowsy.  If you smoke, do not smoke without supervision.  Keep all follow-up visits as told by your health care provider. This is important. Contact a health care provider if:  You keep feeling nauseous or you keep vomiting.  You feel light-headed.  You develop a rash.  You have a fever. Get help right away if:  You have trouble breathing. Summary  For several hours after your procedure, you may feel  sleepy and have poor judgment.  Have a responsible adult stay with you for at least 24  hours or until you are awake and alert. This information is not intended to replace advice given to you by your health care provider. Make sure you discuss any questions you have with your health care provider. Document Revised: 08/27/2017 Document Reviewed: 09/19/2015 Elsevier Patient Education  [place].

## 2020-01-07 ENCOUNTER — Encounter (INDEPENDENT_AMBULATORY_CARE_PROVIDER_SITE_OTHER): Payer: Self-pay | Admitting: Gastroenterology

## 2020-01-07 ENCOUNTER — Encounter (HOSPITAL_COMMUNITY): Payer: Self-pay

## 2020-01-07 ENCOUNTER — Telehealth: Payer: Self-pay | Admitting: Neurology

## 2020-01-07 ENCOUNTER — Encounter (HOSPITAL_COMMUNITY)
Admission: RE | Admit: 2020-01-07 | Discharge: 2020-01-07 | Disposition: A | Discharge status: Home / Self Care | Payer: Medicare Other | Source: Ambulatory Visit | Attending: Gastroenterology | Admitting: Gastroenterology

## 2020-01-07 ENCOUNTER — Other Ambulatory Visit: Payer: Self-pay

## 2020-01-07 ENCOUNTER — Other Ambulatory Visit (HOSPITAL_COMMUNITY)
Admission: RE | Admit: 2020-01-07 | Discharge: 2020-01-07 | Disposition: A | Discharge status: Home / Self Care | Payer: Medicare Other | Source: Ambulatory Visit | Attending: Gastroenterology | Admitting: Gastroenterology

## 2020-01-07 DIAGNOSIS — Z01812 Encounter for preprocedural laboratory examination: Secondary | ICD-10-CM | POA: Diagnosis not present

## 2020-01-07 DIAGNOSIS — Z20822 Contact with and (suspected) exposure to covid-19: Secondary | ICD-10-CM | POA: Insufficient documentation

## 2020-01-07 HISTORY — DX: Sleep apnea, unspecified: G47.30

## 2020-01-07 HISTORY — DX: Other complications of anesthesia, initial encounter: T88.59XA

## 2020-01-07 LAB — BASIC METABOLIC PANEL
Anion gap: 10 (ref 5–15)
BUN: 25 mg/dL — ABNORMAL HIGH (ref 8–23)
CO2: 23 mmol/L (ref 22–32)
Calcium: 9.4 mg/dL (ref 8.9–10.3)
Chloride: 103 mmol/L (ref 98–111)
Creatinine, Ser: 0.9 mg/dL (ref 0.44–1.00)
GFR calc Af Amer: >60 mL/min (ref >60)
GFR calc non Af Amer: >60 mL/min (ref >60)
Glucose, Bld: 211 mg/dL — ABNORMAL HIGH (ref 70–99)
Potassium: 3.4 mmol/L — ABNORMAL LOW (ref 3.5–5.1)
Sodium: 136 mmol/L (ref 135–145)

## 2020-01-07 LAB — SARS CORONAVIRUS 2 (TAT 6-24 HRS): SARS Coronavirus 2: NEGATIVE

## 2020-01-07 NOTE — Telephone Encounter (Signed)
Noted  

## 2020-01-07 NOTE — Telephone Encounter (Signed)
I would favor the follow-up appointment first with Dr. Jannifer Franklin.  It looks like it has been scheduled, nothing further needed.

## 2020-01-07 NOTE — Progress Notes (Signed)
Procedure for EGD and Colonoscopy is cancelled for Friday 01/09/2120.  Pt has an an internal supraclinoid carotid aneurysm that was diagnosed in 2018.   She will need to follow up with Margette Fast MD  For surgery clearance. A recheck was recommended but patient did not follow up.

## 2020-01-07 NOTE — Telephone Encounter (Signed)
[name] with [facility] called stating the Patient is scheduled to have surgery this Friday 01/09/20 but she has had a brain aneurysm and needs to clear because she has to be put to sleep. Would you be okay with her having the surgery or does she need to come in an have a follow up before her surgery? [name] phone number is [phone] & [phone].

## 2020-01-07 NOTE — Progress Notes (Signed)
Pt has an an internal supraclinoid carotid aneurysm that was diagnosed in 2018.  She will need to follow up with Margette Fast MD For surgery clearance based on pre-op evaluation. A recheck was recommended but patient did not follow up. Patient is cancelled until she follows with Dr. Jannifer Franklin Neurology.  Harvel Quale, MD Gastroenterology and Hepatology [facility]

## 2020-01-07 NOTE — Telephone Encounter (Signed)
Olivia Mackie called back to check the status of this and I advised her that Dr. Jannifer Franklin is not here this week and that we were reaching out to the on call doctor. She said that they would like to just go ahead and have her see Dr. Jannifer Franklin first and then have the patient contact them back to reschedule the surgery. I have scheduled the patient for next Thursday with Dr. Jannifer Franklin.

## 2020-01-09 ENCOUNTER — Ambulatory Visit (HOSPITAL_COMMUNITY): Admission: RE | Admit: 2020-01-09 | Payer: Medicare Other | Source: Home / Self Care | Admitting: Gastroenterology

## 2020-01-09 ENCOUNTER — Encounter (HOSPITAL_COMMUNITY): Admission: RE | Payer: Self-pay | Source: Home / Self Care

## 2020-01-09 SURGERY — ESOPHAGOGASTRODUODENOSCOPY (EGD) WITH PROPOFOL
Anesthesia: Monitor Anesthesia Care

## 2020-01-12 ENCOUNTER — Ambulatory Visit (HOSPITAL_COMMUNITY)
Admission: RE | Admit: 2020-01-12 | Discharge: 2020-01-12 | Disposition: A | Discharge status: Home / Self Care | Payer: Medicare Other | Source: Ambulatory Visit | Attending: Gastroenterology | Admitting: Gastroenterology

## 2020-01-12 ENCOUNTER — Other Ambulatory Visit: Payer: Self-pay

## 2020-01-12 DIAGNOSIS — K769 Liver disease, unspecified: Secondary | ICD-10-CM | POA: Diagnosis not present

## 2020-01-12 DIAGNOSIS — D3501 Benign neoplasm of right adrenal gland: Secondary | ICD-10-CM | POA: Diagnosis not present

## 2020-01-12 DIAGNOSIS — D1803 Hemangioma of intra-abdominal structures: Secondary | ICD-10-CM | POA: Diagnosis not present

## 2020-01-12 DIAGNOSIS — K7689 Other specified diseases of liver: Secondary | ICD-10-CM | POA: Diagnosis not present

## 2020-01-12 MED ORDER — GADOBUTROL 1 MMOL/ML IV SOLN
7.0000 mL | Freq: Once | INTRAVENOUS | Status: AC | PRN
  Administered 2020-01-12: 7 mL via INTRAVENOUS

## 2020-01-13 ENCOUNTER — Other Ambulatory Visit (INDEPENDENT_AMBULATORY_CARE_PROVIDER_SITE_OTHER): Payer: Self-pay | Admitting: Gastroenterology

## 2020-01-13 ENCOUNTER — Telehealth (INDEPENDENT_AMBULATORY_CARE_PROVIDER_SITE_OTHER): Payer: Self-pay | Admitting: Gastroenterology

## 2020-01-13 NOTE — Telephone Encounter (Signed)
I called the patient today to inform her about the results of the MRI which showed presence of hemangiomas but no other liver lesions.  I informed her that the lesions do not need to be monitored.  She reports that after discussing her dysphagia and occasional diarrhea episodes with her husband, she would like to postpone her EGD and colonoscopy for now has she had a normal esophagram and her symptoms are very occasional, but also she has to address her arterial aneurysm issue before.  I think this is reasonable.  The patient will let us know if she changes her mind and she has been cleared by the neurologist.  [name], MD Gastroenterology and Hepatology [facility]

## 2020-01-15 ENCOUNTER — Encounter: Payer: Self-pay | Admitting: Neurology

## 2020-01-15 ENCOUNTER — Ambulatory Visit (HOSPITAL_COMMUNITY): Payer: Medicare Other

## 2020-01-15 ENCOUNTER — Telehealth: Payer: Self-pay | Admitting: Neurology

## 2020-01-15 ENCOUNTER — Ambulatory Visit (INDEPENDENT_AMBULATORY_CARE_PROVIDER_SITE_OTHER): Payer: Medicare Other | Admitting: Neurology

## 2020-01-15 VITALS — BP 125/85 | HR 79 | Ht 66.0 in | Wt 181.0 lb

## 2020-01-15 DIAGNOSIS — I671 Cerebral aneurysm, nonruptured: Secondary | ICD-10-CM

## 2020-01-15 DIAGNOSIS — G603 Idiopathic progressive neuropathy: Secondary | ICD-10-CM | POA: Diagnosis not present

## 2020-01-15 DIAGNOSIS — E538 Deficiency of other specified B group vitamins: Secondary | ICD-10-CM

## 2020-01-15 DIAGNOSIS — M0579 Rheumatoid arthritis with rheumatoid factor of multiple sites without organ or systems involvement: Secondary | ICD-10-CM | POA: Diagnosis not present

## 2020-01-15 NOTE — Patient Instructions (Signed)
Start alpha lipoic acid.

## 2020-01-15 NOTE — Telephone Encounter (Signed)
Medicare/[place]ual of [place] order sent to GI. No auth they will reach out to the patient to schedule.  

## 2020-01-15 NOTE — Progress Notes (Signed)
Reason for visit: Migraine headache, cerebral aneurysm, diabetic peripheral neuropathy  [name] is an 69 y.o. female  History of present illness:  [name] is a 69 year old right-handed white female with a history of a lupus-like syndrome, rheumatoid arthritis, migraine headaches, and a possible carotid aneurysm.  The patient returns for evaluation.  She is still having some migraine headaches, up to 3-4 of month, she is on Topamax taking 50 mg at night.  The patient was to follow-up with a MRA of the head in the summer 2020, but never got the study scheduled.  She has reported some recent onset of some right-sided neck pain.  In the last several months she has also noted some numbness and tingling sensations and burning in the balls of the feet.  She does have a history of diabetes, her most recent hemoglobin A1c was 9.0, she recently was placed on Metformin.  The patient reports some mild balance changes.  She comes in for further evaluation.  She continues to have some problems with double vision, she will be seen by an ophthalmologist to recalibrate her prisms.  Past Medical History:  Diagnosis Date  . Basal cell carcinoma   . 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 Complication of anesthesia    difficult to arouse  . Diabetes ([place])   . IBS (irritable bowel syndrome)   . Lupus ([place])   . Migraine headache 09/11/2018  . Neuropathy   . Sleep apnea     Past Surgical History:  Procedure Laterality Date  . BIOPSY BREAST Right    negative  . GALLBLADDER SURGERY    . TENDON EXPLORATION     arms/hand    Family History  Problem Relation Age of Onset  . Cerebral aneurysm Mother   . High blood pressure Mother   . Thyroid disease Mother   . COPD Father     Social history:  reports that she has quit smoking. She has never used smokeless tobacco. She reports that she does not drink alcohol and does not use drugs.      Allergies  Allergen Reactions  . Clindamycin/Lincomycin Hives  . Sulfa Antibiotics Nausea And Vomiting    Medications:  Prior to Admission medications   Medication Sig Start Date End Date Taking? Authorizing Provider  acetaminophen (TYLENOL) 500 MG tablet Take 500-1,000 mg by mouth every 6 (six) hours as needed (for pain.).   Yes [provider]  bismuth subsalicylate (PEPTO BISMOL) 262 MG/15ML suspension Take 15-20 mLs by mouth daily as needed (upset stomach).    Yes [provider]  calcium carbonate (TUMS - DOSED IN MG ELEMENTAL CALCIUM) 500 MG chewable tablet Chew 2 tablets by mouth 2 (two) times daily as needed for indigestion or heartburn.   Yes [provider]  Cholecalciferol (VITAMIN D3) 50 MCG (2000 UT) TABS Take 2,000 Units by mouth daily.   Yes [provider]  FLUoxetine (PROZAC) 20 MG capsule Take 20 mg by mouth daily. 01/23/17  Yes [provider]  folic acid (FOLVITE) 1 MG tablet Take 3 mg by mouth daily.   Yes [provider]  lisinopril-hydrochlorothiazide (PRINZIDE,ZESTORETIC) 10-12.5 MG tablet Take 1 tablet by mouth daily.   Yes [provider]  metFORMIN (GLUCOPHAGE) 500 MG tablet Take by mouth 2 (two) times daily with a meal.   Yes [provider]  pantoprazole (PROTONIX) 40 MG tablet Take 40 mg by mouth daily before breakfast.  Yes [provider]  pioglitazone (ACTOS) 15 MG tablet Take 15 mg by mouth daily. 01/05/17  Yes [provider]  predniSONE (DELTASONE) 5 MG tablet Take 5 mg by mouth daily. 12/12/19  Yes [provider]  tocilizumab (ACTEMRA) 400 MG/20ML SOLN injection Inject 400 mg into the vein every 30 (thirty) days.    Yes [provider]  topiramate (TOPAMAX) 25 MG tablet Take 3 tablets (75 mg total) by mouth at bedtime. Patient taking differently: Take 50 mg by mouth at bedtime.  09/07/17  Yes Kathrynn Ducking, MD  zolpidem (AMBIEN) 10 MG tablet Take  10 mg by mouth at bedtime.    Yes [provider]    ROS:  Out of a complete 14 system review of symptoms, the patient complains only of the following symptoms, and all other reviewed systems are negative.  Double vision Neck pain Burning in the feet  Blood pressure 125/85, pulse 79, height 5\' 6"  (1.676 m), weight 181 lb (82.1 kg).  Physical Exam  General: The patient is alert and cooperative at the time of the examination.  Skin: No significant peripheral edema is noted.   Neurologic Exam  Mental status: The patient is alert and oriented x 3 at the time of the examination. The patient has apparent normal recent and remote memory, with an apparently normal attention span and concentration ability.   Cranial nerves: Facial symmetry is present. Speech is normal, no aphasia or dysarthria is noted. Extraocular movements are full. Visual fields are full.  Motor: The patient has good strength in all 4 extremities.  Sensory examination: Soft touch sensation is symmetric on the face, arms, and legs.  Coordination: The patient has good finger-nose-finger and heel-to-shin bilaterally.  Gait and station: The patient has a normal gait. Tandem gait is slightly unsteady. Romberg is negative. No drift is seen.  Reflexes: Deep tendon reflexes are symmetric.   Assessment/Plan:  1.  Migraine headache  2.  Diabetes with probable diabetic neuropathy  3.  Cerebral aneurysm  4.  Double vision  The patient will be set up for blood work looking for other causes of neuropathy.  The patient does not wish to go on medication for her neuropathy but she may try alpha lipoic acid or CBD oil.  The patient is having some double vision, she will be seen for reevaluation of her glasses prisms.  She may opt to go up on the Topamax to 75 mg daily for the migraine.  She will be sent for MRI of the head.  She will follow up in 6 months.  Jill Alexanders MD 01/15/2020 10:26 AM  [facility] [address] [place],  [address]  Phone [phone] Fax [phone]

## 2020-01-16 DIAGNOSIS — M0579 Rheumatoid arthritis with rheumatoid factor of multiple sites without organ or systems involvement: Secondary | ICD-10-CM | POA: Diagnosis not present

## 2020-01-16 DIAGNOSIS — Z79899 Other long term (current) drug therapy: Secondary | ICD-10-CM | POA: Diagnosis not present

## 2020-01-16 DIAGNOSIS — Z6829 Body mass index (BMI) 29.0-29.9, adult: Secondary | ICD-10-CM | POA: Diagnosis not present

## 2020-01-16 DIAGNOSIS — M329 Systemic lupus erythematosus, unspecified: Secondary | ICD-10-CM | POA: Diagnosis not present

## 2020-01-16 DIAGNOSIS — E663 Overweight: Secondary | ICD-10-CM | POA: Diagnosis not present

## 2020-01-16 DIAGNOSIS — M3500 Sicca syndrome, unspecified: Secondary | ICD-10-CM | POA: Diagnosis not present

## 2020-01-16 DIAGNOSIS — M2550 Pain in unspecified joint: Secondary | ICD-10-CM | POA: Diagnosis not present

## 2020-01-16 DIAGNOSIS — G629 Polyneuropathy, unspecified: Secondary | ICD-10-CM | POA: Diagnosis not present

## 2020-01-19 LAB — MULTIPLE MYELOMA PANEL, SERUM
Albumin SerPl Elph-Mcnc: 3.4 g/dL (ref 2.9–4.4)
Albumin/Glob SerPl: 0.9 (ref 0.7–1.7)
Alpha 1: 0.2 g/dL (ref 0.0–0.4)
Alpha2 Glob SerPl Elph-Mcnc: 0.9 g/dL (ref 0.4–1.0)
B-Globulin SerPl Elph-Mcnc: 1.1 g/dL (ref 0.7–1.3)
Gamma Glob SerPl Elph-Mcnc: 1.6 g/dL (ref 0.4–1.8)
Globulin, Total: 3.8 g/dL (ref 2.2–3.9)
IgA/Immunoglobulin A, Serum: 323 mg/dL (ref 64–422)
IgG (Immunoglobin G), Serum: 1512 mg/dL (ref 586–1602)
IgM (Immunoglobulin M), Srm: 54 mg/dL (ref 26–217)
Total Protein: 7.2 g/dL (ref 6.0–8.5)

## 2020-01-19 LAB — COPPER, SERUM: Copper: 96 ug/dL (ref 80–158)

## 2020-01-19 LAB — ANGIOTENSIN CONVERTING ENZYME: Angio Convert Enzyme: 8 U/L — ABNORMAL LOW (ref 14–82)

## 2020-01-19 LAB — B. BURGDORFI ANTIBODIES: Lyme IgG/IgM Ab: <0.91 {ISR} (ref 0.00–0.90)

## 2020-01-19 LAB — VITAMIN B12: Vitamin B-12: 307 pg/mL (ref 232–1245)

## 2020-01-20 ENCOUNTER — Ambulatory Visit
Admission: RE | Admit: 2020-01-20 | Discharge: 2020-01-20 | Disposition: A | Discharge status: Home / Self Care | Payer: Medicare Other | Source: Ambulatory Visit | Attending: Neurology | Admitting: Neurology

## 2020-01-20 DIAGNOSIS — I671 Cerebral aneurysm, nonruptured: Secondary | ICD-10-CM

## 2020-01-21 ENCOUNTER — Telehealth: Payer: Self-pay | Admitting: Neurology

## 2020-01-21 NOTE — Telephone Encounter (Signed)
  I called patient.  MRA of the head is stable, shows a small 2.5 mm outpouching from the left internal carotid artery that could be an aneurysm, however it is stable from 2018.   MRA head 01/21/20:  IMPRESSION: This MR angiogram of the intracranial arteries shows a small 2.5 mm outpouching of the left supraclinoid internal carotid artery. It is unchanged compared to the 02/04/2017 MRA.  This could represent a small aneurysm or an enlarged arterial infundibulum.

## 2020-01-22 ENCOUNTER — Telehealth: Payer: Self-pay

## 2020-01-22 NOTE — Telephone Encounter (Signed)
Pt verified by name and DOB, results given per provider, pt voiced understanding all question answered. 

## 2020-01-22 NOTE — Telephone Encounter (Signed)
-----   Message from Kathrynn Ducking, MD sent at 01/20/2020 12:09 PM EDT ----- The blood work results are unremarkable. Please call our office if you have any questions. ----- Message ----- From: Interface, [facility] Lab Results In Sent: 01/16/2020   7:38 AM EDT To: Kathrynn Ducking, MD

## 2020-01-26 ENCOUNTER — Ambulatory Visit (HOSPITAL_COMMUNITY): Payer: Medicare Other

## 2020-02-10 DIAGNOSIS — I10 Essential (primary) hypertension: Secondary | ICD-10-CM | POA: Diagnosis not present

## 2020-02-10 DIAGNOSIS — E7849 Other hyperlipidemia: Secondary | ICD-10-CM | POA: Diagnosis not present

## 2020-02-10 DIAGNOSIS — E1165 Type 2 diabetes mellitus with hyperglycemia: Secondary | ICD-10-CM | POA: Diagnosis not present

## 2020-02-10 DIAGNOSIS — Z7984 Long term (current) use of oral hypoglycemic drugs: Secondary | ICD-10-CM | POA: Diagnosis not present

## 2020-02-11 ENCOUNTER — Ambulatory Visit: Payer: Self-pay | Admitting: Neurology

## 2020-02-23 DIAGNOSIS — J0101 Acute recurrent maxillary sinusitis: Secondary | ICD-10-CM | POA: Diagnosis not present

## 2020-02-23 DIAGNOSIS — Z20828 Contact with and (suspected) exposure to other viral communicable diseases: Secondary | ICD-10-CM | POA: Diagnosis not present

## 2020-03-11 DIAGNOSIS — E1165 Type 2 diabetes mellitus with hyperglycemia: Secondary | ICD-10-CM | POA: Diagnosis not present

## 2020-03-11 DIAGNOSIS — E7849 Other hyperlipidemia: Secondary | ICD-10-CM | POA: Diagnosis not present

## 2020-03-11 DIAGNOSIS — I10 Essential (primary) hypertension: Secondary | ICD-10-CM | POA: Diagnosis not present

## 2020-03-11 DIAGNOSIS — Z7984 Long term (current) use of oral hypoglycemic drugs: Secondary | ICD-10-CM | POA: Diagnosis not present

## 2020-03-15 DIAGNOSIS — J0101 Acute recurrent maxillary sinusitis: Secondary | ICD-10-CM | POA: Diagnosis not present

## 2020-03-15 DIAGNOSIS — R350 Frequency of micturition: Secondary | ICD-10-CM | POA: Diagnosis not present

## 2020-03-17 DIAGNOSIS — G629 Polyneuropathy, unspecified: Secondary | ICD-10-CM | POA: Diagnosis not present

## 2020-03-17 DIAGNOSIS — E663 Overweight: Secondary | ICD-10-CM | POA: Diagnosis not present

## 2020-03-17 DIAGNOSIS — M2550 Pain in unspecified joint: Secondary | ICD-10-CM | POA: Diagnosis not present

## 2020-03-17 DIAGNOSIS — Z6829 Body mass index (BMI) 29.0-29.9, adult: Secondary | ICD-10-CM | POA: Diagnosis not present

## 2020-03-17 DIAGNOSIS — Z79899 Other long term (current) drug therapy: Secondary | ICD-10-CM | POA: Diagnosis not present

## 2020-03-17 DIAGNOSIS — M329 Systemic lupus erythematosus, unspecified: Secondary | ICD-10-CM | POA: Diagnosis not present

## 2020-03-17 DIAGNOSIS — M0579 Rheumatoid arthritis with rheumatoid factor of multiple sites without organ or systems involvement: Secondary | ICD-10-CM | POA: Diagnosis not present

## 2020-03-17 DIAGNOSIS — M3500 Sicca syndrome, unspecified: Secondary | ICD-10-CM | POA: Diagnosis not present

## 2020-03-22 DIAGNOSIS — M0579 Rheumatoid arthritis with rheumatoid factor of multiple sites without organ or systems involvement: Secondary | ICD-10-CM | POA: Diagnosis not present

## 2020-03-26 DIAGNOSIS — E11319 Type 2 diabetes mellitus with unspecified diabetic retinopathy without macular edema: Secondary | ICD-10-CM | POA: Diagnosis not present

## 2020-03-31 DIAGNOSIS — H5021 Vertical strabismus, right eye: Secondary | ICD-10-CM | POA: Diagnosis not present

## 2020-03-31 DIAGNOSIS — H50011 Monocular esotropia, right eye: Secondary | ICD-10-CM | POA: Diagnosis not present

## 2020-04-07 ENCOUNTER — Other Ambulatory Visit: Payer: Self-pay

## 2020-04-07 ENCOUNTER — Encounter (INDEPENDENT_AMBULATORY_CARE_PROVIDER_SITE_OTHER): Payer: Medicare Other | Admitting: Ophthalmology

## 2020-04-07 DIAGNOSIS — H43813 Vitreous degeneration, bilateral: Secondary | ICD-10-CM | POA: Diagnosis not present

## 2020-04-07 DIAGNOSIS — M069 Rheumatoid arthritis, unspecified: Secondary | ICD-10-CM

## 2020-04-07 DIAGNOSIS — H4423 Degenerative myopia, bilateral: Secondary | ICD-10-CM | POA: Diagnosis not present

## 2020-04-07 DIAGNOSIS — I10 Essential (primary) hypertension: Secondary | ICD-10-CM

## 2020-04-07 DIAGNOSIS — H35033 Hypertensive retinopathy, bilateral: Secondary | ICD-10-CM

## 2020-04-10 DIAGNOSIS — E1165 Type 2 diabetes mellitus with hyperglycemia: Secondary | ICD-10-CM | POA: Diagnosis not present

## 2020-04-10 DIAGNOSIS — Z7984 Long term (current) use of oral hypoglycemic drugs: Secondary | ICD-10-CM | POA: Diagnosis not present

## 2020-04-10 DIAGNOSIS — I10 Essential (primary) hypertension: Secondary | ICD-10-CM | POA: Diagnosis not present

## 2020-04-10 DIAGNOSIS — E7849 Other hyperlipidemia: Secondary | ICD-10-CM | POA: Diagnosis not present

## 2020-04-20 DIAGNOSIS — Z79899 Other long term (current) drug therapy: Secondary | ICD-10-CM | POA: Diagnosis not present

## 2020-04-20 DIAGNOSIS — M0579 Rheumatoid arthritis with rheumatoid factor of multiple sites without organ or systems involvement: Secondary | ICD-10-CM | POA: Diagnosis not present

## 2020-04-26 DIAGNOSIS — I10 Essential (primary) hypertension: Secondary | ICD-10-CM | POA: Diagnosis not present

## 2020-04-26 DIAGNOSIS — E1143 Type 2 diabetes mellitus with diabetic autonomic (poly)neuropathy: Secondary | ICD-10-CM | POA: Diagnosis not present

## 2020-04-26 DIAGNOSIS — K2100 Gastro-esophageal reflux disease with esophagitis, without bleeding: Secondary | ICD-10-CM | POA: Diagnosis not present

## 2020-04-26 DIAGNOSIS — E559 Vitamin D deficiency, unspecified: Secondary | ICD-10-CM | POA: Diagnosis not present

## 2020-04-26 DIAGNOSIS — E782 Mixed hyperlipidemia: Secondary | ICD-10-CM | POA: Diagnosis not present

## 2020-04-28 DIAGNOSIS — N183 Chronic kidney disease, stage 3 unspecified: Secondary | ICD-10-CM | POA: Diagnosis not present

## 2020-04-28 DIAGNOSIS — E1165 Type 2 diabetes mellitus with hyperglycemia: Secondary | ICD-10-CM | POA: Diagnosis not present

## 2020-04-28 DIAGNOSIS — Z0001 Encounter for general adult medical examination with abnormal findings: Secondary | ICD-10-CM | POA: Diagnosis not present

## 2020-04-28 DIAGNOSIS — Z23 Encounter for immunization: Secondary | ICD-10-CM | POA: Diagnosis not present

## 2020-04-28 DIAGNOSIS — I10 Essential (primary) hypertension: Secondary | ICD-10-CM | POA: Diagnosis not present

## 2020-04-28 DIAGNOSIS — E782 Mixed hyperlipidemia: Secondary | ICD-10-CM | POA: Diagnosis not present

## 2020-04-28 DIAGNOSIS — M069 Rheumatoid arthritis, unspecified: Secondary | ICD-10-CM | POA: Diagnosis not present

## 2020-04-28 DIAGNOSIS — E1143 Type 2 diabetes mellitus with diabetic autonomic (poly)neuropathy: Secondary | ICD-10-CM | POA: Diagnosis not present

## 2020-05-11 DIAGNOSIS — I10 Essential (primary) hypertension: Secondary | ICD-10-CM | POA: Diagnosis not present

## 2020-05-11 DIAGNOSIS — E1165 Type 2 diabetes mellitus with hyperglycemia: Secondary | ICD-10-CM | POA: Diagnosis not present

## 2020-05-11 DIAGNOSIS — Z7984 Long term (current) use of oral hypoglycemic drugs: Secondary | ICD-10-CM | POA: Diagnosis not present

## 2020-05-18 DIAGNOSIS — M0579 Rheumatoid arthritis with rheumatoid factor of multiple sites without organ or systems involvement: Secondary | ICD-10-CM | POA: Diagnosis not present

## 2020-06-11 DIAGNOSIS — Z7984 Long term (current) use of oral hypoglycemic drugs: Secondary | ICD-10-CM | POA: Diagnosis not present

## 2020-06-11 DIAGNOSIS — E1165 Type 2 diabetes mellitus with hyperglycemia: Secondary | ICD-10-CM | POA: Diagnosis not present

## 2020-06-11 DIAGNOSIS — E7849 Other hyperlipidemia: Secondary | ICD-10-CM | POA: Diagnosis not present

## 2020-06-11 DIAGNOSIS — I10 Essential (primary) hypertension: Secondary | ICD-10-CM | POA: Diagnosis not present

## 2020-06-16 DIAGNOSIS — H50011 Monocular esotropia, right eye: Secondary | ICD-10-CM | POA: Diagnosis not present

## 2020-06-16 DIAGNOSIS — H532 Diplopia: Secondary | ICD-10-CM | POA: Diagnosis not present

## 2020-06-16 DIAGNOSIS — H3530 Unspecified macular degeneration: Secondary | ICD-10-CM | POA: Diagnosis not present

## 2020-06-16 DIAGNOSIS — H26493 Other secondary cataract, bilateral: Secondary | ICD-10-CM | POA: Diagnosis not present

## 2020-06-16 DIAGNOSIS — E1136 Type 2 diabetes mellitus with diabetic cataract: Secondary | ICD-10-CM | POA: Diagnosis not present

## 2020-06-16 DIAGNOSIS — M069 Rheumatoid arthritis, unspecified: Secondary | ICD-10-CM | POA: Diagnosis not present

## 2020-06-16 DIAGNOSIS — H5021 Vertical strabismus, right eye: Secondary | ICD-10-CM | POA: Diagnosis not present

## 2020-06-16 DIAGNOSIS — Z7984 Long term (current) use of oral hypoglycemic drugs: Secondary | ICD-10-CM | POA: Diagnosis not present

## 2020-06-21 DIAGNOSIS — M0579 Rheumatoid arthritis with rheumatoid factor of multiple sites without organ or systems involvement: Secondary | ICD-10-CM | POA: Diagnosis not present

## 2020-06-28 ENCOUNTER — Telehealth: Payer: Self-pay | Admitting: Neurology

## 2020-06-28 NOTE — Telephone Encounter (Signed)
The patient was seen by Dr. Sanda Klein, plans are to check an acetylcholine receptor antibody level, thyroid peroxidase autoantibody, and get an MRI of the orbits.

## 2020-07-07 DIAGNOSIS — E663 Overweight: Secondary | ICD-10-CM | POA: Diagnosis not present

## 2020-07-07 DIAGNOSIS — E669 Obesity, unspecified: Secondary | ICD-10-CM | POA: Diagnosis not present

## 2020-07-07 DIAGNOSIS — M2550 Pain in unspecified joint: Secondary | ICD-10-CM | POA: Diagnosis not present

## 2020-07-07 DIAGNOSIS — Z6829 Body mass index (BMI) 29.0-29.9, adult: Secondary | ICD-10-CM | POA: Diagnosis not present

## 2020-07-07 DIAGNOSIS — Z79899 Other long term (current) drug therapy: Secondary | ICD-10-CM | POA: Diagnosis not present

## 2020-07-07 DIAGNOSIS — Z6830 Body mass index (BMI) 30.0-30.9, adult: Secondary | ICD-10-CM | POA: Diagnosis not present

## 2020-07-07 DIAGNOSIS — M3500 Sicca syndrome, unspecified: Secondary | ICD-10-CM | POA: Diagnosis not present

## 2020-07-07 DIAGNOSIS — M0579 Rheumatoid arthritis with rheumatoid factor of multiple sites without organ or systems involvement: Secondary | ICD-10-CM | POA: Diagnosis not present

## 2020-07-07 DIAGNOSIS — M329 Systemic lupus erythematosus, unspecified: Secondary | ICD-10-CM | POA: Diagnosis not present

## 2020-07-07 DIAGNOSIS — G629 Polyneuropathy, unspecified: Secondary | ICD-10-CM | POA: Diagnosis not present

## 2020-07-10 DIAGNOSIS — M3210 Systemic lupus erythematosus, organ or system involvement unspecified: Secondary | ICD-10-CM | POA: Diagnosis not present

## 2020-07-10 DIAGNOSIS — E1165 Type 2 diabetes mellitus with hyperglycemia: Secondary | ICD-10-CM | POA: Diagnosis not present

## 2020-07-10 DIAGNOSIS — Z7984 Long term (current) use of oral hypoglycemic drugs: Secondary | ICD-10-CM | POA: Diagnosis not present

## 2020-07-10 DIAGNOSIS — E7849 Other hyperlipidemia: Secondary | ICD-10-CM | POA: Diagnosis not present

## 2020-07-10 DIAGNOSIS — H532 Diplopia: Secondary | ICD-10-CM | POA: Diagnosis not present

## 2020-07-10 DIAGNOSIS — I10 Essential (primary) hypertension: Secondary | ICD-10-CM | POA: Diagnosis not present

## 2020-07-14 DIAGNOSIS — Z6830 Body mass index (BMI) 30.0-30.9, adult: Secondary | ICD-10-CM | POA: Diagnosis not present

## 2020-07-14 DIAGNOSIS — J029 Acute pharyngitis, unspecified: Secondary | ICD-10-CM | POA: Diagnosis not present

## 2020-07-20 ENCOUNTER — Ambulatory Visit: Payer: Medicare Other | Admitting: Neurology

## 2020-07-20 DIAGNOSIS — H5021 Vertical strabismus, right eye: Secondary | ICD-10-CM | POA: Diagnosis not present

## 2020-07-20 DIAGNOSIS — H50011 Monocular esotropia, right eye: Secondary | ICD-10-CM | POA: Diagnosis not present

## 2020-07-21 DIAGNOSIS — J358 Other chronic diseases of tonsils and adenoids: Secondary | ICD-10-CM | POA: Diagnosis not present

## 2020-07-21 DIAGNOSIS — D105 Benign neoplasm of other parts of oropharynx: Secondary | ICD-10-CM | POA: Diagnosis not present

## 2020-07-22 DIAGNOSIS — M0579 Rheumatoid arthritis with rheumatoid factor of multiple sites without organ or systems involvement: Secondary | ICD-10-CM | POA: Diagnosis not present

## 2020-07-26 ENCOUNTER — Telehealth: Payer: Self-pay | Admitting: Neurology

## 2020-07-26 NOTE — Telephone Encounter (Signed)
The patient was seen by Dr. Sanda Klein.  Apparently a repeat acetylcholine receptor antibody level, binding was mildly positive, this felt that she likely has myasthenia gravis.  MRI of the brain and orbit was unremarkable.  There was some evidence of a left oropharyngeal T2 hyperintense enhancing lesion, further investigation was recommended.

## 2020-08-20 DIAGNOSIS — E7849 Other hyperlipidemia: Secondary | ICD-10-CM | POA: Diagnosis not present

## 2020-08-20 DIAGNOSIS — E781 Pure hyperglyceridemia: Secondary | ICD-10-CM | POA: Diagnosis not present

## 2020-08-20 DIAGNOSIS — M0579 Rheumatoid arthritis with rheumatoid factor of multiple sites without organ or systems involvement: Secondary | ICD-10-CM | POA: Diagnosis not present

## 2020-08-20 DIAGNOSIS — I10 Essential (primary) hypertension: Secondary | ICD-10-CM | POA: Diagnosis not present

## 2020-08-20 DIAGNOSIS — K2100 Gastro-esophageal reflux disease with esophagitis, without bleeding: Secondary | ICD-10-CM | POA: Diagnosis not present

## 2020-08-20 DIAGNOSIS — E1143 Type 2 diabetes mellitus with diabetic autonomic (poly)neuropathy: Secondary | ICD-10-CM | POA: Diagnosis not present

## 2020-08-20 DIAGNOSIS — E559 Vitamin D deficiency, unspecified: Secondary | ICD-10-CM | POA: Diagnosis not present

## 2020-08-20 DIAGNOSIS — E782 Mixed hyperlipidemia: Secondary | ICD-10-CM | POA: Diagnosis not present

## 2020-08-20 DIAGNOSIS — M329 Systemic lupus erythematosus, unspecified: Secondary | ICD-10-CM | POA: Diagnosis not present

## 2020-08-20 DIAGNOSIS — Z1159 Encounter for screening for other viral diseases: Secondary | ICD-10-CM | POA: Diagnosis not present

## 2020-08-20 DIAGNOSIS — E1165 Type 2 diabetes mellitus with hyperglycemia: Secondary | ICD-10-CM | POA: Diagnosis not present

## 2020-08-20 DIAGNOSIS — N183 Chronic kidney disease, stage 3 unspecified: Secondary | ICD-10-CM | POA: Diagnosis not present

## 2020-08-24 DIAGNOSIS — N183 Chronic kidney disease, stage 3 unspecified: Secondary | ICD-10-CM | POA: Diagnosis not present

## 2020-08-24 DIAGNOSIS — E559 Vitamin D deficiency, unspecified: Secondary | ICD-10-CM | POA: Diagnosis not present

## 2020-08-24 DIAGNOSIS — D72819 Decreased white blood cell count, unspecified: Secondary | ICD-10-CM | POA: Diagnosis not present

## 2020-08-24 DIAGNOSIS — M3210 Systemic lupus erythematosus, organ or system involvement unspecified: Secondary | ICD-10-CM | POA: Diagnosis not present

## 2020-08-24 DIAGNOSIS — E1143 Type 2 diabetes mellitus with diabetic autonomic (poly)neuropathy: Secondary | ICD-10-CM | POA: Diagnosis not present

## 2020-08-24 DIAGNOSIS — I10 Essential (primary) hypertension: Secondary | ICD-10-CM | POA: Diagnosis not present

## 2020-08-24 DIAGNOSIS — E1165 Type 2 diabetes mellitus with hyperglycemia: Secondary | ICD-10-CM | POA: Diagnosis not present

## 2020-08-24 DIAGNOSIS — J029 Acute pharyngitis, unspecified: Secondary | ICD-10-CM | POA: Diagnosis not present

## 2020-09-01 DIAGNOSIS — Z23 Encounter for immunization: Secondary | ICD-10-CM | POA: Diagnosis not present

## 2020-09-08 DIAGNOSIS — I10 Essential (primary) hypertension: Secondary | ICD-10-CM | POA: Diagnosis not present

## 2020-09-08 DIAGNOSIS — M3210 Systemic lupus erythematosus, organ or system involvement unspecified: Secondary | ICD-10-CM | POA: Diagnosis not present

## 2020-09-08 DIAGNOSIS — E1165 Type 2 diabetes mellitus with hyperglycemia: Secondary | ICD-10-CM | POA: Diagnosis not present

## 2020-09-08 DIAGNOSIS — Z7984 Long term (current) use of oral hypoglycemic drugs: Secondary | ICD-10-CM | POA: Diagnosis not present

## 2020-09-08 DIAGNOSIS — E7849 Other hyperlipidemia: Secondary | ICD-10-CM | POA: Diagnosis not present

## 2020-10-04 DIAGNOSIS — M0579 Rheumatoid arthritis with rheumatoid factor of multiple sites without organ or systems involvement: Secondary | ICD-10-CM | POA: Diagnosis not present

## 2020-10-05 DIAGNOSIS — E669 Obesity, unspecified: Secondary | ICD-10-CM | POA: Diagnosis not present

## 2020-10-05 DIAGNOSIS — M2550 Pain in unspecified joint: Secondary | ICD-10-CM | POA: Diagnosis not present

## 2020-10-05 DIAGNOSIS — M329 Systemic lupus erythematosus, unspecified: Secondary | ICD-10-CM | POA: Diagnosis not present

## 2020-10-05 DIAGNOSIS — Z6831 Body mass index (BMI) 31.0-31.9, adult: Secondary | ICD-10-CM | POA: Diagnosis not present

## 2020-10-05 DIAGNOSIS — Z79899 Other long term (current) drug therapy: Secondary | ICD-10-CM | POA: Diagnosis not present

## 2020-10-05 DIAGNOSIS — M0579 Rheumatoid arthritis with rheumatoid factor of multiple sites without organ or systems involvement: Secondary | ICD-10-CM | POA: Diagnosis not present

## 2020-10-05 DIAGNOSIS — M3500 Sicca syndrome, unspecified: Secondary | ICD-10-CM | POA: Diagnosis not present

## 2020-10-06 ENCOUNTER — Other Ambulatory Visit: Payer: Self-pay

## 2020-10-06 ENCOUNTER — Encounter: Payer: Self-pay | Admitting: Neurology

## 2020-10-06 ENCOUNTER — Ambulatory Visit (INDEPENDENT_AMBULATORY_CARE_PROVIDER_SITE_OTHER): Payer: Medicare Other | Admitting: Neurology

## 2020-10-06 VITALS — BP 143/79 | HR 69 | Ht 66.0 in | Wt 191.0 lb

## 2020-10-06 DIAGNOSIS — H532 Diplopia: Secondary | ICD-10-CM | POA: Diagnosis not present

## 2020-10-06 DIAGNOSIS — G43009 Migraine without aura, not intractable, without status migrainosus: Secondary | ICD-10-CM | POA: Diagnosis not present

## 2020-10-06 MED ORDER — PYRIDOSTIGMINE BROMIDE 60 MG PO TABS: 30.0000 mg | ORAL_TABLET | Freq: Three times a day (TID) | ORAL | 3 refills | Status: DC | PRN

## 2020-10-06 NOTE — Progress Notes (Signed)
I have read the note, and I agree with the clinical assessment and plan.  Kateria Cutrona K Nance Mccombs   

## 2020-10-06 NOTE — Patient Instructions (Signed)
Try Mestinon 30 mg 3 times daily as needed for double vision  Watch out for diarrhea Call for dose adjustments  See you back in 4 months  Pyridostigmine tablets, regular release What is this medicine? PYRIDOSTIGMINE (peer id oh STIG meen) can help with muscle strength. It is used to treat myasthenia gravis. This medicine may be used for other purposes; ask your health care provider or pharmacist if you have questions. COMMON BRAND NAME(S): Mestinon What should I tell my health care provider before I take this medicine? They need to know if you have any of these conditions:  asthma  difficulty passing urine  heart disease  infection in abdomen, peritonitis  irregular, slow heartbeat  kidney disease  seizures  stomach or bowel obstruction or ulcers  thyroid disease  an unusual or allergic reaction to pyridostigmine, bromides, other medicines, foods, dyes, or preservatives  pregnant or trying to get pregnant  breast-feeding How should I use this medicine? Take this medicine by mouth with a glass of water. Follow the directions on the prescription label. Take your medicine at regular intervals. Do not take your medicine more often than directed. Do not stop taking except on your doctor's advice. Talk to your pediatrician regarding the use of this medicine in children. Special care may be needed. Overdosage: If you think you have taken too much of this medicine contact a poison control center or emergency room at once. NOTE: This medicine is only for you. Do not share this medicine with others. What if I miss a dose? If you miss a dose, take it as soon as you can. If it is almost time for your next dose, take only that dose. Do not take double or extra doses. What may interact with this medicine? Do not take this medicine with any of the following medications:  other medicines for myasthenia gravis like neostigmine  quinine This medicine may also interact with the  following medications:  atropine  bethanechol  disopyramide  edrophonium  guanadrel  guanethidine  mecamylamine  medicines that block muscle or nerve pain This list may not describe all possible interactions. Give your health care provider a list of all the medicines, herbs, non-prescription drugs, or dietary supplements you use. Also tell them if you smoke, drink alcohol, or use illegal drugs. Some items may interact with your medicine. What should I watch for while using this medicine? Visit your doctor or health care professional for regular checks on your progress. Tell your doctor if your symptoms do not improve or if they get worse. Wear a medical ID bracelet or chain, and carry a card that describes your disease and details of your medicine and dosage times. What side effects may I notice from receiving this medicine? Side effects that you should report to your doctor or health care professional as soon as possible:  allergic reactions like skin rash, itching or hives, swelling of the face, lips, or tongue  breathing problems  changes in vision  muscle cramps, spasm  slow or irregular heartbeat  stomach cramps, pain  unusually weak or tired  vomiting Side effects that usually do not require medical attention (report to your doctor or health care professional if they continue or are bothersome):  diarrhea, especially at start of treatment  increased saliva  increased sweating  nausea This list may not describe all possible side effects. Call your doctor for medical advice about side effects. You may report side effects to FDA at 1-800-FDA-1088. Where should I keep  my medicine? Keep out of the reach of children. Store at room temperature between 15 and 30 degrees C (59 and 86 degrees F). Keep container tightly closed. Throw away any unused medicine after the expiration date. NOTE: This sheet is a summary. It may not cover all possible information. If you have  questions about this medicine, talk to your doctor, pharmacist, or health care provider.  2021 Elsevier/Gold Standard (2007-12-26 15:11:50)

## 2020-10-06 NOTE — Progress Notes (Signed)
PATIENT: [name] DOB: January 04, 1951  REASON FOR VISIT: follow up HISTORY FROM: patient  HISTORY OF PRESENT ILLNESS: Today 10/06/20 [name] is a 70 year old female with history of lupus-like syndrome, rheumatoid arthritis, migraine headaches, and possible carotid aneurysm.  MRA of the head in August 2021 showed a stable small 2.5 mm outpouching from the left internal carotid artery that could be an aneurysm.  Saw Dr. Jolyn Zimmerman, around February 2022 acetylcholine receptor antibody level was mildly positive, felt she likely has myasthenia gravis.  MRI of the brain and orbit was unremarkable.  There was some evidence of a left oropharyngeal T2 hyperintense enhancing lesion.  Several years ago started having headaches, double vision, had prisms in glasses, in right lens. Headaches stopped with prisms and Topamax.  Double vision worsened around the end of 2021, prompting further investigation by [name].   No double vision as long as wearing glasses with prisms. Working on getting lens embedded in glasses. Does feel her eyes get tired with use. Right eyelid swelling, using hot compresses, assumes stye. Has arthritis, orthopedic issues, soreness, no muscle weakness. No problems chewing, trouble with swallow is reflux related.  Does have gait instability, but no falls.  Lives with her husband, they live in [place], she does local driving.  Here today for evaluation accompanied by her husband.  HISTORY  01/15/2020 [name]: [name] is a 70 year old right-handed white female with a history of a lupus-like syndrome, rheumatoid arthritis, migraine headaches, and a possible carotid aneurysm.  The patient returns for evaluation.  She is still having some migraine headaches, up to 3-4 of month, she is on Topamax taking 50 mg at night.  The patient was to follow-up with a MRA of the head in the summer 2020, but never got the study scheduled.  She has reported some recent onset of some  right-sided neck pain.  In the last several months she has also noted some numbness and tingling sensations and burning in the balls of the feet.  She does have a history of diabetes, her most recent hemoglobin A1c was 9.0, she recently was placed on Metformin.  The patient reports some mild balance changes.  She comes in for further evaluation.  She continues to have some problems with double vision, she will be seen by an ophthalmologist to recalibrate her prisms  REVIEW OF SYSTEMS: Out of a complete 14 system review of symptoms, the patient complains only of the following symptoms, and all other reviewed systems are negative.  Double vision  ALLERGIES: Allergies  Allergen Reactions  . Clindamycin/Lincomycin Hives  . Sulfa Antibiotics Nausea And Vomiting    HOME MEDICATIONS: Outpatient Medications Prior to Visit  Medication Sig Dispense Refill  . acetaminophen (TYLENOL) 500 MG tablet Take 500-1,000 mg by mouth every 6 (six) hours as needed (for pain.).    Marland Kitchen bismuth subsalicylate (PEPTO BISMOL) 262 MG/15ML suspension Take 15-20 mLs by mouth daily as needed (upset stomach).     . calcium carbonate (TUMS - DOSED IN MG ELEMENTAL CALCIUM) 500 MG chewable tablet Chew 2 tablets by mouth 2 (two) times daily as needed for indigestion or heartburn.    . Cholecalciferol (VITAMIN D3) 50 MCG (2000 UT) TABS Take 2,000 Units by mouth daily.    Marland Kitchen FLUoxetine (PROZAC) 20 MG capsule Take 20 mg by mouth daily.  2  . hydroxychloroquine (PLAQUENIL) 200 MG tablet Take by mouth.    Marland Kitchen lisinopril-hydrochlorothiazide (PRINZIDE,ZESTORETIC) 10-12.5 MG tablet Take 1 tablet by mouth  daily.    . metFORMIN (GLUCOPHAGE) 500 MG tablet Take by mouth 2 (two) times daily with a meal.    . pantoprazole (PROTONIX) 40 MG tablet Take 40 mg by mouth daily before breakfast.     . pioglitazone (ACTOS) 15 MG tablet Take 15 mg by mouth daily.  6  . tocilizumab (ACTEMRA) 400 MG/20ML SOLN injection Inject 400 mg into the vein every 30  (thirty) days.     Marland Kitchen topiramate (TOPAMAX) 25 MG tablet Take 3 tablets (75 mg total) by mouth at bedtime. (Patient taking differently: Take 50 mg by mouth at bedtime.) 270 tablet 3  . zolpidem (AMBIEN) 10 MG tablet Take 10 mg by mouth at bedtime.    . folic acid (FOLVITE) 1 MG tablet Take 3 mg by mouth daily.    . predniSONE (DELTASONE) 5 MG tablet Take 5 mg by mouth daily.     No facility-administered medications prior to visit.    PAST MEDICAL HISTORY: Past Medical History:  Diagnosis Date  . Basal cell carcinoma   . 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 Complication of anesthesia    difficult to arouse  . Diabetes ([place])   . IBS (irritable bowel syndrome)   . Lupus ([place])   . Migraine headache 09/11/2018  . Neuropathy   . Sleep apnea     PAST SURGICAL HISTORY: Past Surgical History:  Procedure Laterality Date  . BIOPSY BREAST Right    negative  . GALLBLADDER SURGERY    . TENDON EXPLORATION     arms/hand    FAMILY HISTORY: Family History  Problem Relation Age of Onset  . Cerebral aneurysm Mother   . High blood pressure Mother   . Thyroid disease Mother   . COPD Father     SOCIAL HISTORY: Social History   Socioeconomic History  . Marital status: Married    Spouse name: Jeneen Rinks  . Number of children: 2  . Years of education: Some college  . Highest education level: Not on file  Occupational History  . Occupation: Retired  Tobacco Use  . Smoking status: Former Research scientist (life sciences)  . Smokeless tobacco: Never Used  Vaping Use  . Vaping Use: Never used  Substance and Sexual Activity  . Alcohol use: No  . Drug use: No  . Sexual activity: Not on file  Other Topics Concern  . Not on file  Social History Narrative   Lives with husband   Caffeine use: Coffee (2 cups per day)   2 glasses tea per day in summer   Soda sometimes   Right handed   Social Determinants of Health   Financial Resource Strain: Not on file  Food  Insecurity: Not on file  Transportation Needs: Not on file  Physical Activity: Not on file  Stress: Not on file  Social Connections: Not on file  Intimate Partner Violence: Not on file   PHYSICAL EXAM  Vitals:   10/06/20 1005  BP: (!) 143/79  Pulse: 69  Weight: 191 lb (86.6 kg)  Height: 5\' 6"  (1.676 m)   Body mass index is 30.83 kg/m.  Generalized: Well developed, in no acute distress   Neurological examination  Mentation: Alert oriented to time, place, history taking. Follows all commands speech and language fluent Cranial nerve II-XII: Pupils were equal round reactive to light. Extraocular movements were full, visual field were full on confrontational test. Swelling to right eye lid, appears stye. Facial sensation and strength were  normal.  Head turning and shoulder shrug  were normal and symmetric.  With superior gaze for 1 minute, there is subjective diplopia around 30 seconds, no ptosis was noted.  No eye open weakness, cheek puff weakness, or tongue weakness. Motor: The motor testing reveals 5 over 5 strength of all 4 extremities. Good symmetric motor tone is noted throughout.  Mild weakness of the deltoids after 1 minute Sensory: Sensory testing is intact to soft touch on all 4 extremities. No evidence of extinction is noted.  Coordination: Cerebellar testing reveals good finger-nose-finger and heel-to-shin bilaterally.  Gait and station: Gait is normal. Tandem gait is slightly unsteady. Romberg is negative. No drift is seen.  Reflexes: Deep tendon reflexes are symmetric and normal bilaterally.   DIAGNOSTIC DATA (LABS, IMAGING, TESTING) - I reviewed patient records, labs, notes, testing and imaging myself where available.  Lab Results  Component Value Date   HGB 12.6 12/09/2009   HCT 37.0 12/09/2009   PLT 168 12/09/2009      Component Value Date/Time   NA 136 01/07/2020 1145   K 3.4 (L) 01/07/2020 1145   CL 103 01/07/2020 1145   CO2 23 01/07/2020 1145   GLUCOSE 211  (H) 01/07/2020 1145   BUN 25 (H) 01/07/2020 1145   CREATININE 0.90 01/07/2020 1145   CALCIUM 9.4 01/07/2020 1145   PROT 7.2 01/15/2020 1100   GFRNONAA >60 01/07/2020 1145   GFRAA >60 01/07/2020 1145   No results found for: CHOL, HDL, LDLCALC, LDLDIRECT, TRIG, CHOLHDL No results found for: HGBA1C Lab Results  Component Value Date   VITAMINB12 307 01/15/2020   Lab Results  Component Value Date   TSH 0.916 01/24/2017   ASSESSMENT AND PLAN 70 y.o. year old female  has a past medical history of Basal cell carcinoma, Cerebral aneurysm without rupture (09/11/7406), Complication of anesthesia, Diabetes ([place]), IBS (irritable bowel syndrome), Lupus ([place]), Migraine headache (09/11/2018), Neuropathy, and Sleep apnea. here with:  1.  Double vision -Sees Dr. Jolyn Zimmerman, question MG, acetylcholine receptor antibody level binding was mildly positive (0.1 in Jan 2022), was 0.10 in August 2018 -Working with ophthalmology on prisms, trying to determine stability of double vision, before investing in the cost of a permanent lens, right now using stick on  -No significant muscle weakness noted on exam, does have diplopia with superior gaze around 30 seconds, no ptosis was noted -Will give a trial of Mestinon 60 mg tablet, 1/2 tablet up to 3 times daily as needed, knows to watch out for diarrhea -Overall, her symptoms are not especially bothersome, the double vision is 90% managed with prisms -Follow-up in 4 months or sooner if needed, let us know of any worsening symptoms or generalized weakness  2.  Migraine headache -Well managed with Topamax  3.  Cerebral aneurysm -August 2021 MRA head is stable, showed small 2.5 mm outpouching from the left internal carotid artery that could be any aneurysm, stable from 2018  I spent 30 minutes of face-to-face and non-face-to-face time with patient.  This included previsit chart review, lab review, study review, order entry, electronic health record documentation,  patient education.  Butler Denmark, AGNP-C, DNP 10/06/2020, 10:43 AM [facility] [address], [place], [place] [address] [phone]

## 2020-10-11 DIAGNOSIS — J358 Other chronic diseases of tonsils and adenoids: Secondary | ICD-10-CM | POA: Diagnosis not present

## 2020-10-11 DIAGNOSIS — D105 Benign neoplasm of other parts of oropharynx: Secondary | ICD-10-CM | POA: Diagnosis not present

## 2020-10-11 DIAGNOSIS — Z87891 Personal history of nicotine dependence: Secondary | ICD-10-CM | POA: Diagnosis not present

## 2020-11-02 DIAGNOSIS — M0579 Rheumatoid arthritis with rheumatoid factor of multiple sites without organ or systems involvement: Secondary | ICD-10-CM | POA: Diagnosis not present

## 2020-11-02 DIAGNOSIS — D72819 Decreased white blood cell count, unspecified: Secondary | ICD-10-CM | POA: Diagnosis not present

## 2020-11-08 DIAGNOSIS — E1165 Type 2 diabetes mellitus with hyperglycemia: Secondary | ICD-10-CM | POA: Diagnosis not present

## 2020-11-08 DIAGNOSIS — E7849 Other hyperlipidemia: Secondary | ICD-10-CM | POA: Diagnosis not present

## 2020-11-08 DIAGNOSIS — Z7984 Long term (current) use of oral hypoglycemic drugs: Secondary | ICD-10-CM | POA: Diagnosis not present

## 2020-11-08 DIAGNOSIS — I10 Essential (primary) hypertension: Secondary | ICD-10-CM | POA: Diagnosis not present

## 2020-11-08 DIAGNOSIS — M3210 Systemic lupus erythematosus, organ or system involvement unspecified: Secondary | ICD-10-CM | POA: Diagnosis not present

## 2020-11-15 DIAGNOSIS — M810 Age-related osteoporosis without current pathological fracture: Secondary | ICD-10-CM | POA: Diagnosis not present

## 2020-11-15 DIAGNOSIS — Z780 Asymptomatic menopausal state: Secondary | ICD-10-CM | POA: Diagnosis not present

## 2020-11-16 DIAGNOSIS — H02402 Unspecified ptosis of left eyelid: Secondary | ICD-10-CM | POA: Diagnosis not present

## 2020-11-16 DIAGNOSIS — H50011 Monocular esotropia, right eye: Secondary | ICD-10-CM | POA: Diagnosis not present

## 2020-11-16 DIAGNOSIS — H5021 Vertical strabismus, right eye: Secondary | ICD-10-CM | POA: Diagnosis not present

## 2020-11-30 DIAGNOSIS — Z79899 Other long term (current) drug therapy: Secondary | ICD-10-CM | POA: Diagnosis not present

## 2020-11-30 DIAGNOSIS — Z111 Encounter for screening for respiratory tuberculosis: Secondary | ICD-10-CM | POA: Diagnosis not present

## 2020-11-30 DIAGNOSIS — R5383 Other fatigue: Secondary | ICD-10-CM | POA: Diagnosis not present

## 2020-11-30 DIAGNOSIS — M0579 Rheumatoid arthritis with rheumatoid factor of multiple sites without organ or systems involvement: Secondary | ICD-10-CM | POA: Diagnosis not present

## 2020-12-28 DIAGNOSIS — D72819 Decreased white blood cell count, unspecified: Secondary | ICD-10-CM | POA: Diagnosis not present

## 2020-12-28 DIAGNOSIS — M0579 Rheumatoid arthritis with rheumatoid factor of multiple sites without organ or systems involvement: Secondary | ICD-10-CM | POA: Diagnosis not present

## 2020-12-28 DIAGNOSIS — M329 Systemic lupus erythematosus, unspecified: Secondary | ICD-10-CM | POA: Diagnosis not present

## 2020-12-29 DIAGNOSIS — E1143 Type 2 diabetes mellitus with diabetic autonomic (poly)neuropathy: Secondary | ICD-10-CM | POA: Diagnosis not present

## 2020-12-29 DIAGNOSIS — K2100 Gastro-esophageal reflux disease with esophagitis, without bleeding: Secondary | ICD-10-CM | POA: Diagnosis not present

## 2020-12-29 DIAGNOSIS — E782 Mixed hyperlipidemia: Secondary | ICD-10-CM | POA: Diagnosis not present

## 2020-12-29 DIAGNOSIS — E7849 Other hyperlipidemia: Secondary | ICD-10-CM | POA: Diagnosis not present

## 2020-12-29 DIAGNOSIS — E1165 Type 2 diabetes mellitus with hyperglycemia: Secondary | ICD-10-CM | POA: Diagnosis not present

## 2020-12-29 DIAGNOSIS — I10 Essential (primary) hypertension: Secondary | ICD-10-CM | POA: Diagnosis not present

## 2020-12-29 DIAGNOSIS — M329 Systemic lupus erythematosus, unspecified: Secondary | ICD-10-CM | POA: Diagnosis not present

## 2020-12-29 DIAGNOSIS — E559 Vitamin D deficiency, unspecified: Secondary | ICD-10-CM | POA: Diagnosis not present

## 2020-12-29 DIAGNOSIS — N183 Chronic kidney disease, stage 3 unspecified: Secondary | ICD-10-CM | POA: Diagnosis not present

## 2021-01-03 DIAGNOSIS — K580 Irritable bowel syndrome with diarrhea: Secondary | ICD-10-CM | POA: Diagnosis not present

## 2021-01-03 DIAGNOSIS — Z23 Encounter for immunization: Secondary | ICD-10-CM | POA: Diagnosis not present

## 2021-01-03 DIAGNOSIS — G619 Inflammatory polyneuropathy, unspecified: Secondary | ICD-10-CM | POA: Diagnosis not present

## 2021-01-03 DIAGNOSIS — Z1331 Encounter for screening for depression: Secondary | ICD-10-CM | POA: Diagnosis not present

## 2021-01-03 DIAGNOSIS — E1143 Type 2 diabetes mellitus with diabetic autonomic (poly)neuropathy: Secondary | ICD-10-CM | POA: Diagnosis not present

## 2021-01-03 DIAGNOSIS — I10 Essential (primary) hypertension: Secondary | ICD-10-CM | POA: Diagnosis not present

## 2021-01-03 DIAGNOSIS — Z1389 Encounter for screening for other disorder: Secondary | ICD-10-CM | POA: Diagnosis not present

## 2021-01-03 DIAGNOSIS — E1165 Type 2 diabetes mellitus with hyperglycemia: Secondary | ICD-10-CM | POA: Diagnosis not present

## 2021-01-09 DIAGNOSIS — I10 Essential (primary) hypertension: Secondary | ICD-10-CM | POA: Diagnosis not present

## 2021-01-09 DIAGNOSIS — Z7984 Long term (current) use of oral hypoglycemic drugs: Secondary | ICD-10-CM | POA: Diagnosis not present

## 2021-01-09 DIAGNOSIS — M3210 Systemic lupus erythematosus, organ or system involvement unspecified: Secondary | ICD-10-CM | POA: Diagnosis not present

## 2021-01-09 DIAGNOSIS — E1165 Type 2 diabetes mellitus with hyperglycemia: Secondary | ICD-10-CM | POA: Diagnosis not present

## 2021-01-09 DIAGNOSIS — E7849 Other hyperlipidemia: Secondary | ICD-10-CM | POA: Diagnosis not present

## 2021-01-25 ENCOUNTER — Encounter: Payer: Self-pay | Admitting: Dermatology

## 2021-01-25 ENCOUNTER — Ambulatory Visit (INDEPENDENT_AMBULATORY_CARE_PROVIDER_SITE_OTHER): Payer: Medicare Other | Admitting: Dermatology

## 2021-01-25 ENCOUNTER — Other Ambulatory Visit: Payer: Self-pay

## 2021-01-25 DIAGNOSIS — D0471 Carcinoma in situ of skin of right lower limb, including hip: Secondary | ICD-10-CM

## 2021-01-25 DIAGNOSIS — D485 Neoplasm of uncertain behavior of skin: Secondary | ICD-10-CM

## 2021-01-25 DIAGNOSIS — C4492 Squamous cell carcinoma of skin, unspecified: Secondary | ICD-10-CM

## 2021-01-25 DIAGNOSIS — L570 Actinic keratosis: Secondary | ICD-10-CM | POA: Diagnosis not present

## 2021-01-25 DIAGNOSIS — Z1283 Encounter for screening for malignant neoplasm of skin: Secondary | ICD-10-CM

## 2021-01-25 DIAGNOSIS — C44319 Basal cell carcinoma of skin of other parts of face: Secondary | ICD-10-CM | POA: Diagnosis not present

## 2021-01-25 DIAGNOSIS — C4491 Basal cell carcinoma of skin, unspecified: Secondary | ICD-10-CM

## 2021-01-25 DIAGNOSIS — Z86018 Personal history of other benign neoplasm: Secondary | ICD-10-CM | POA: Diagnosis not present

## 2021-01-25 DIAGNOSIS — Z85828 Personal history of other malignant neoplasm of skin: Secondary | ICD-10-CM

## 2021-01-25 DIAGNOSIS — C44719 Basal cell carcinoma of skin of left lower limb, including hip: Secondary | ICD-10-CM

## 2021-01-25 DIAGNOSIS — D0472 Carcinoma in situ of skin of left lower limb, including hip: Secondary | ICD-10-CM | POA: Diagnosis not present

## 2021-01-25 HISTORY — DX: Basal cell carcinoma of skin, unspecified: C44.91

## 2021-01-25 HISTORY — DX: Squamous cell carcinoma of skin, unspecified: C44.92

## 2021-01-25 NOTE — Patient Instructions (Addendum)
Biopsy, Surgery (Curettage) & Surgery (Excision) Aftercare Instructions  1. Okay to remove bandage in 24 hours  2. Wash area with soap and water  3. Apply Vaseline to area twice daily until healed (Not Neosporin)  4. Okay to cover with a Band-Aid to decrease the chance of infection or prevent irritation from clothing; also it's okay to uncover lesion at home.  5. Suture instructions: return to our office in 7-10 or 10-14 days for a nurse visit for suture removal. Variable healing with sutures, if pain or itching occurs call our office. It's okay to shower or bathe 24 hours after sutures are given.  6. The following risks may occur after a biopsy, curettage or excision: bleeding, scarring, discoloration, recurrence, infection (redness, yellow drainage, pain or swelling).  7. For questions, concerns and results call our office at [facility] before 4pm & Friday before 3pm. Biopsy results will be available in 1 week.   Get OTC Lotrisone that you will use that every morning for 3 weeks and  OTC Hydrocortisone Ointment that you will use every night for 3 weeks.

## 2021-01-26 DIAGNOSIS — M0579 Rheumatoid arthritis with rheumatoid factor of multiple sites without organ or systems involvement: Secondary | ICD-10-CM | POA: Diagnosis not present

## 2021-02-02 ENCOUNTER — Telehealth: Payer: Self-pay | Admitting: *Deleted

## 2021-02-02 NOTE — Telephone Encounter (Signed)
Phone was busy/ no answer.

## 2021-02-02 NOTE — Telephone Encounter (Signed)
Patient returned call for results 

## 2021-02-02 NOTE — Telephone Encounter (Signed)
-----   Message from Lavonna Monarch, MD sent at 02/02/2021  5:08 AM EDT ----- 3 skin cancers, schedule surgery with Dr. Darene Lamer and will decide at the visit which to treat.

## 2021-02-02 NOTE — Telephone Encounter (Signed)
Left message with patients husband to call back for results.

## 2021-02-05 ENCOUNTER — Encounter: Payer: Self-pay | Admitting: Dermatology

## 2021-02-05 NOTE — Progress Notes (Signed)
New Patient   Subjective  [name] is a 70 y.o. female who presents for the following: New Patient (Initial Visit) (Patient here today for yearly skin check. Per patient she has a lesion on her left jawline x couple of months no bleeding, sensitive to touch. Per patient the tip of her nose does get a scab that comes and goes. Also irritation on patient's left lower lip on the inside x 1 year, per patient she has asked her Dentist about it and the dentist recommended changing mouthwash and toothpaste but it continues to come back. Personal history of atypical mole and non mole skin cancer. ).  Annual skin examination, several spots she would like checked. Location:  Duration:  Quality:  Associated Signs/Symptoms: Modifying Factors:  Severity:  Timing: Context:    The following portions of the chart were reviewed this encounter and updated as appropriate:      Objective  Well appearing patient in no apparent distress; mood and affect are within normal limits. Torso - Posterior (Back) General skin examination: No atypical pigmented lesions.  3 possible new nonmelanoma skin cancers will be biopsied.  Left Posterior Mandible Pearly 7 mm pink papule, dermoscopy typical of BCC       Left Lower Leg - Anterior 9 mm waxy pink crust, rule out superficial carcinoma       Right Lower Inner Leg 1 cm crust with focal pigmentation, suspect pigmented non-melanoma skin cancer.       Nose While her history would fit actinic keratosis on her nose possibly lip, neither she nor I could feel her see any spots today.  The left may also be angular cheilitis and she could certainly try some over-the-counter 1% hydrocortisone ointment as soon as it recurs (nightly for 5 nights).    A full examination was performed including scalp, head, eyes, ears, nose, lips, neck, chest, axillae, abdomen, back, buttocks, bilateral upper extremities, bilateral lower extremities, hands, feet,  fingers, toes, fingernails, and toenails. All findings within normal limits unless otherwise noted below.  Areas beneath undergarments not fully examined.   Assessment & Plan  Skin exam for malignant neoplasm Torso - Posterior (Back)  Annual skin examination.  Encouraged to self examine twice annually.  Continue ultraviolet protection.  Neoplasm of uncertain behavior of skin (3) Left Posterior Mandible  Skin / nail biopsy Type of biopsy: tangential   Informed consent: discussed and consent obtained   Timeout: patient name, date of birth, surgical site, and procedure verified   Procedure prep:  Patient was prepped and draped in usual sterile fashion (Non sterile) Prep type:  Chlorhexidine Anesthesia: the lesion was anesthetized in a standard fashion   Anesthetic:  1% lidocaine w/ epinephrine 1-100,000 local infiltration Instrument used: flexible razor blade   Hemostasis achieved with: ferric subsulfate   Outcome: patient tolerated procedure well   Post-procedure details: sterile dressing applied and wound care instructions given   Dressing type: bandage and petrolatum    Specimen 1 - Surgical pathology Differential Diagnosis: R/O BCC vs SCC  Check Margins: No  Left Lower Leg - Anterior  Skin / nail biopsy Type of biopsy: tangential   Informed consent: discussed and consent obtained   Timeout: patient name, date of birth, surgical site, and procedure verified   Procedure prep:  Patient was prepped and draped in usual sterile fashion (Non sterile) Prep type:  Chlorhexidine Anesthesia: the lesion was anesthetized in a standard fashion   Anesthetic:  1% lidocaine w/ epinephrine 1-100,000 local  infiltration Instrument used: flexible razor blade   Hemostasis achieved with: ferric subsulfate   Outcome: patient tolerated procedure well   Post-procedure details: sterile dressing applied and wound care instructions given   Dressing type: pressure dressing and petrolatum     Specimen 2 - Surgical pathology Differential Diagnosis: R/O BCC vs SCC  Check Margins: No  Right Lower Inner Leg  Skin / nail biopsy Type of biopsy: tangential   Informed consent: discussed and consent obtained   Timeout: patient name, date of birth, surgical site, and procedure verified   Procedure prep:  Patient was prepped and draped in usual sterile fashion (Non sterile) Prep type:  Chlorhexidine Anesthesia: the lesion was anesthetized in a standard fashion   Anesthetic:  1% lidocaine w/ epinephrine 1-100,000 local infiltration Instrument used: flexible razor blade   Hemostasis achieved with: ferric subsulfate   Outcome: patient tolerated procedure well   Post-procedure details: sterile dressing applied and wound care instructions given   Dressing type: petrolatum and pressure dressing    Specimen 3 - Surgical pathology Differential Diagnosis: R/O BCC vs SCC  Check Margins: No  AK (actinic keratosis) Nose  Recheck here in recurrence

## 2021-02-07 ENCOUNTER — Telehealth: Payer: Self-pay | Admitting: Dermatology

## 2021-02-07 NOTE — Telephone Encounter (Signed)
Path to patient. Made a surgery appointment with Dr.Tafeen.

## 2021-02-07 NOTE — Telephone Encounter (Signed)
Returning call for results  °

## 2021-02-08 ENCOUNTER — Encounter: Payer: Self-pay | Admitting: Neurology

## 2021-02-08 ENCOUNTER — Ambulatory Visit (INDEPENDENT_AMBULATORY_CARE_PROVIDER_SITE_OTHER): Payer: Medicare Other | Admitting: Neurology

## 2021-02-08 VITALS — BP 109/73 | HR 79 | Ht 66.0 in | Wt 192.2 lb

## 2021-02-08 DIAGNOSIS — H532 Diplopia: Secondary | ICD-10-CM | POA: Diagnosis not present

## 2021-02-08 DIAGNOSIS — R413 Other amnesia: Secondary | ICD-10-CM

## 2021-02-08 DIAGNOSIS — I671 Cerebral aneurysm, nonruptured: Secondary | ICD-10-CM

## 2021-02-08 DIAGNOSIS — R251 Tremor, unspecified: Secondary | ICD-10-CM

## 2021-02-08 DIAGNOSIS — E538 Deficiency of other specified B group vitamins: Secondary | ICD-10-CM

## 2021-02-08 DIAGNOSIS — R5382 Chronic fatigue, unspecified: Secondary | ICD-10-CM

## 2021-02-08 DIAGNOSIS — G43009 Migraine without aura, not intractable, without status migrainosus: Secondary | ICD-10-CM

## 2021-02-08 HISTORY — DX: Tremor, unspecified: R25.1

## 2021-02-08 NOTE — Progress Notes (Signed)
Reason for visit: Headache, double vision  [name] is an 70 y.o. female  History of present illness:  [name] is a 70 year old right-handed white female with history of rheumatoid arthritis and a lupus-like syndrome.  She has a history of headaches that have not been much of a problem recently.  She does have some double vision issues, there is some question whether she may have ocular myasthenia but a brief trial on Mestinon offered no benefit.  She could not tolerate the Mestinon for very long secondary to diarrhea.  The patient has prisms in her glasses which do help the double vision.  She has a history of diabetes as well.  Over the last 6 months she has noticed some change in her memory, she has reported some difficulty with memory for names of people and with word finding.  She has not altered any of her activities of daily living because of memory.  She does have some numbness in the feet associated with a diabetic neuropathy.  She has some discomfort with this but this is not significant.  She sleeps well on Ambien.  She does not wish to go on any new medications for her neuropathy or for her arthritis discomfort in her shoulders bilaterally.  She does have a tremor that has been present for a year or 2.  She comes here for further evaluation.  Past Medical History:  Diagnosis Date   Atypical mole 11/19/2012   Mid Right Back (moderate to severe) (wider shave)   Basal cell carcinoma    BCC (basal cell carcinoma of skin) 09/17/2013   Left Inner Lower Leg (Ulcerated) (curet and excision)   Cerebral aneurysm without rupture 09/11/2018   2.5 mm supraclinoid left carotid artery   Complication of anesthesia    difficult to arouse   Diabetes ([facility])    IBS (irritable bowel syndrome)    Lupus ([facility])    Migraine headache 09/11/2018   Neuropathy    Sleep apnea    Squamous cell carcinoma of skin 10/27/2015   Left Inner Calf (in situ) (curet, 5FU and cautery)   Superficial basal  cell carcinoma (BCC) 10/14/2008   Left Inner Lower Leg ([place], LN2)   Superficial basal cell carcinoma (BCC) 10/14/2008   Left Post Inf Calf ([place], LN2)   Superficial basal cell carcinoma (BCC) 10/14/2008   Left Post Sup Calf ([place], LN2)   Superficial basal cell carcinoma (BCC) 09/17/2013   Left Post Inf Calf (curet and 5FU)    Past Surgical History:  Procedure Laterality Date   BIOPSY BREAST Right    negative   GALLBLADDER SURGERY     TENDON EXPLORATION     arms/hand    Family History  Problem Relation Age of Onset   Cerebral aneurysm Mother    High blood pressure Mother    Thyroid disease Mother    COPD Father     Social history:  reports that she has quit smoking. She has never used smokeless tobacco. She reports that she does not drink alcohol and does not use drugs.    Allergies  Allergen Reactions   Clindamycin/Lincomycin Hives   Mestinon [Pyridostigmine]     GI issues    Sulfa Antibiotics Nausea And Vomiting    Medications:  Prior to Admission medications   Medication Sig Start Date End Date Taking? Authorizing Provider  acetaminophen (TYLENOL) 500 MG tablet Take 500-1,000 mg by mouth every 6 (six) hours as needed (for pain.).  [provider]  bismuth subsalicylate (PEPTO BISMOL) 262 MG/15ML suspension Take 15-20 mLs by mouth daily as needed (upset stomach).     [provider]  calcium carbonate (TUMS - DOSED IN MG ELEMENTAL CALCIUM) 500 MG chewable tablet Chew 2 tablets by mouth 2 (two) times daily as needed for indigestion or heartburn.    [provider]  Cholecalciferol (VITAMIN D3) 50 MCG (2000 UT) TABS Take 2,000 Units by mouth daily.    [provider]  FLUoxetine (PROZAC) 20 MG capsule Take 20 mg by mouth daily. 01/23/17   [provider]  hydroxychloroquine (PLAQUENIL) 200 MG tablet Take by mouth.    [provider]  lisinopril-hydrochlorothiazide (PRINZIDE,ZESTORETIC) 10-12.5 MG tablet  Take 1 tablet by mouth daily.    [provider]  metFORMIN (GLUCOPHAGE-XR) 500 MG 24 hr tablet Take 250 mg by mouth daily. 01/05/21   [provider]  pantoprazole (PROTONIX) 40 MG tablet Take 40 mg by mouth daily before breakfast.     [provider]  pioglitazone (ACTOS) 15 MG tablet Take 15 mg by mouth daily. 01/05/17   [provider]  pyridostigmine (MESTINON) 60 MG tablet Take 0.5 tablets (30 mg total) by mouth 3 (three) times daily as needed. 10/06/20   Suzzanne Cloud, NP  tocilizumab (ACTEMRA) 400 MG/20ML SOLN injection Inject 400 mg into the vein every 30 (thirty) days.     [provider]  topiramate (TOPAMAX) 25 MG tablet Take 3 tablets (75 mg total) by mouth at bedtime. Patient taking differently: Take 50 mg by mouth at bedtime. 09/07/17   Kathrynn Ducking, MD  zolpidem (AMBIEN) 10 MG tablet Take 10 mg by mouth at bedtime.    [provider]    ROS:  Out of a complete 14 system review of symptoms, the patient complains only of the following symptoms, and all other reviewed systems are negative.  Tremor Headache Joint pains Mild memory changes Double vision  Blood pressure 109/73, pulse 79, height '5\' 6"'$  (1.676 m), weight 192 lb 4 oz (87.2 kg).  Physical Exam  General: The patient is alert and cooperative at the time of the examination.  Neuromuscular: The patient has some discomfort with internal and external rotation of the shoulders on both sides.  Skin: 2+ edema below the knees is seen bilaterally.   Neurologic Exam  Mental status: The patient is alert and oriented x 3 at the time of the examination. The patient has apparent normal recent and remote memory, with an apparently normal attention span and concentration ability.   Cranial nerves: Facial symmetry is present. Speech is normal, no aphasia or dysarthria is noted. Extraocular movements are full. Visual fields are full.  Motor: The patient has good strength  in all 4 extremities.  Sensory examination: Soft touch sensation is symmetric on the face, arms, and legs.  Coordination: The patient has good finger-nose-finger and heel-to-shin bilaterally.  Gait and station: The patient has a normal gait. Tandem gait is slightly unsteady.  Romberg is negative. No drift is seen.  Reflexes: Deep tendon reflexes are symmetric.   MRA head 01/21/20:  IMPRESSION:  This MR angiogram of the intracranial arteries shows a small 2.5 mm outpouching of the left supraclinoid internal carotid artery. It is unchanged compared to the 02/04/2017 MRA.  This could represent a small aneurysm or an enlarged arterial infundibulum.   Assessment/Plan:  1.  History of headache  2.  Double vision  3.  Diabetes, diabetic peripheral neuropathy  4.  Mild memory changes  5.  Rheumatoid arthritis, lupus-like syndrome  6.  Tremor  7.  Small carotid aneurysm  The patient has done well with her headaches.  She is having some discomfort in the feet associated with peripheral neuropathy but she does not wish to go on any new medications.  She has arthritic pain in both shoulders.  She has noted some mild memory changes over the last 6 months, will need to follow this over time.  The patient does not wish to pursue MRI of the brain at this time.  She does have a mild tremor that is consistent with a heightened physiologic tremor.  We will check blood work today.  She will follow-up in 6 months, in the future she can be followed through Dr. Brett Fairy.  Jill Alexanders MD 02/08/2021 2:20 PM  [facility] [address] [place], [place] [address]  Phone [phone] Fax [phone]

## 2021-02-09 ENCOUNTER — Telehealth: Payer: Self-pay

## 2021-02-09 LAB — SEDIMENTATION RATE: Sed Rate: 7 mm/hr (ref 0–40)

## 2021-02-09 LAB — VITAMIN B12: Vitamin B-12: 254 pg/mL (ref 232–1245)

## 2021-02-09 LAB — RPR: RPR Ser Ql: NONREACTIVE

## 2021-02-09 LAB — TSH: TSH: 0.921 u[IU]/mL (ref 0.450–4.500)

## 2021-02-09 NOTE — Telephone Encounter (Signed)
-----   Message from Kathrynn Ducking, MD sent at 02/09/2021  7:12 AM EDT -----  The blood work results are unremarkable. Please call the patient.  ----- Message ----- From: Lavone Neri Lab Results In Sent: 02/09/2021   5:37 AM EDT To: Kathrynn Ducking, MD

## 2021-02-09 NOTE — Telephone Encounter (Signed)
I called pt and we discussed results of blood test. She verbalized understanding and had no questions/concerns.

## 2021-02-24 DIAGNOSIS — M0579 Rheumatoid arthritis with rheumatoid factor of multiple sites without organ or systems involvement: Secondary | ICD-10-CM | POA: Diagnosis not present

## 2021-03-01 DIAGNOSIS — H5053 Vertical heterophoria: Secondary | ICD-10-CM | POA: Diagnosis not present

## 2021-03-01 DIAGNOSIS — H5021 Vertical strabismus, right eye: Secondary | ICD-10-CM | POA: Diagnosis not present

## 2021-03-01 DIAGNOSIS — H532 Diplopia: Secondary | ICD-10-CM | POA: Diagnosis not present

## 2021-03-01 DIAGNOSIS — H5051 Esophoria: Secondary | ICD-10-CM | POA: Diagnosis not present

## 2021-03-01 DIAGNOSIS — H50011 Monocular esotropia, right eye: Secondary | ICD-10-CM | POA: Diagnosis not present

## 2021-03-11 DIAGNOSIS — I10 Essential (primary) hypertension: Secondary | ICD-10-CM | POA: Diagnosis not present

## 2021-03-11 DIAGNOSIS — E1165 Type 2 diabetes mellitus with hyperglycemia: Secondary | ICD-10-CM | POA: Diagnosis not present

## 2021-03-11 DIAGNOSIS — Z7984 Long term (current) use of oral hypoglycemic drugs: Secondary | ICD-10-CM | POA: Diagnosis not present

## 2021-03-11 DIAGNOSIS — E7849 Other hyperlipidemia: Secondary | ICD-10-CM | POA: Diagnosis not present

## 2021-03-11 DIAGNOSIS — M3210 Systemic lupus erythematosus, organ or system involvement unspecified: Secondary | ICD-10-CM | POA: Diagnosis not present

## 2021-03-22 DIAGNOSIS — Z1231 Encounter for screening mammogram for malignant neoplasm of breast: Secondary | ICD-10-CM | POA: Diagnosis not present

## 2021-03-25 DIAGNOSIS — Z79899 Other long term (current) drug therapy: Secondary | ICD-10-CM | POA: Diagnosis not present

## 2021-03-25 DIAGNOSIS — M0579 Rheumatoid arthritis with rheumatoid factor of multiple sites without organ or systems involvement: Secondary | ICD-10-CM | POA: Diagnosis not present

## 2021-03-31 ENCOUNTER — Other Ambulatory Visit: Payer: Self-pay

## 2021-03-31 ENCOUNTER — Ambulatory Visit (INDEPENDENT_AMBULATORY_CARE_PROVIDER_SITE_OTHER): Payer: Medicare Other | Admitting: Dermatology

## 2021-03-31 ENCOUNTER — Encounter: Payer: Self-pay | Admitting: Dermatology

## 2021-03-31 DIAGNOSIS — C44719 Basal cell carcinoma of skin of left lower limb, including hip: Secondary | ICD-10-CM | POA: Diagnosis not present

## 2021-03-31 DIAGNOSIS — C44712 Basal cell carcinoma of skin of right lower limb, including hip: Secondary | ICD-10-CM

## 2021-03-31 DIAGNOSIS — C4491 Basal cell carcinoma of skin, unspecified: Secondary | ICD-10-CM

## 2021-03-31 DIAGNOSIS — L570 Actinic keratosis: Secondary | ICD-10-CM | POA: Diagnosis not present

## 2021-03-31 MED ORDER — MUPIROCIN 2 % EX OINT: TOPICAL_OINTMENT | CUTANEOUS | 3 refills | Status: DC

## 2021-03-31 NOTE — Patient Instructions (Signed)

## 2021-04-13 ENCOUNTER — Encounter (INDEPENDENT_AMBULATORY_CARE_PROVIDER_SITE_OTHER): Payer: Medicare Other | Admitting: Ophthalmology

## 2021-04-13 ENCOUNTER — Other Ambulatory Visit: Payer: Self-pay

## 2021-04-13 DIAGNOSIS — M069 Rheumatoid arthritis, unspecified: Secondary | ICD-10-CM | POA: Diagnosis not present

## 2021-04-13 DIAGNOSIS — H4423 Degenerative myopia, bilateral: Secondary | ICD-10-CM

## 2021-04-13 DIAGNOSIS — H43813 Vitreous degeneration, bilateral: Secondary | ICD-10-CM | POA: Diagnosis not present

## 2021-04-13 DIAGNOSIS — H35033 Hypertensive retinopathy, bilateral: Secondary | ICD-10-CM | POA: Diagnosis not present

## 2021-04-13 DIAGNOSIS — Z79899 Other long term (current) drug therapy: Secondary | ICD-10-CM

## 2021-04-13 DIAGNOSIS — I10 Essential (primary) hypertension: Secondary | ICD-10-CM

## 2021-04-17 ENCOUNTER — Encounter: Payer: Self-pay | Admitting: Dermatology

## 2021-04-17 NOTE — Progress Notes (Signed)
   Follow-Up Visit   Subjective  [name] is a 70 y.o. female who presents for the following: Procedure (Here for treatment- left lower leg- anterior, left posterior mandible- bcc x 2 & right lower inner leg- cis x 1).  Multiple biopsy-proven skin cancers, will begin with 2 lesions on legs.  Also new crust on face Location:  Duration:  Quality:  Associated Signs/Symptoms: Modifying Factors:  Severity:  Timing: Context:   Objective  Well appearing patient in no apparent distress; mood and affect are within normal limits. Left Lower Leg - Anterior Biopsy site identified by nurse, patient, and me  Right lower Inner leg Biopsy site identified by nurse, patient, and me  Left Malar Cheek 5 mm gritty pink crust    A focused examination was performed including head, neck, legs, arms.. Relevant physical exam findings are noted in the Assessment and Plan.   Assessment & Plan    Basal cell carcinoma (BCC), unspecified site (2) Left Lower Leg - Anterior  Destruction of lesion Complexity: simple   Destruction method: electrodesiccation and curettage   Informed consent: discussed and consent obtained   Timeout:  patient name, date of birth, surgical site, and procedure verified Anesthesia: the lesion was anesthetized in a standard fashion   Anesthetic:  1% lidocaine w/ epinephrine 1-100,000 local infiltration Curettage performed in three different directions: Yes   Curettage cycles:  3 Lesion length (cm):  1.5 Lesion width (cm):  1.5 Margin per side (cm):  0 Final wound size (cm):  1.5 Hemostasis achieved with:  ferric subsulfate Outcome: patient tolerated procedure well with no complications   Additional details:  Wound innoculated with 5 fluorouracil solution.  Right lower Inner leg  Destruction of lesion Complexity: simple   Destruction method: electrodesiccation and curettage   Informed consent: discussed and consent obtained   Timeout:  patient name, date of  birth, surgical site, and procedure verified Anesthesia: the lesion was anesthetized in a standard fashion   Anesthetic:  1% lidocaine w/ epinephrine 1-100,000 local infiltration Curettage performed in three different directions: Yes   Curettage cycles:  3 Lesion length (cm):  2 Lesion width (cm):  2 Margin per side (cm):  0 Final wound size (cm):  2 Hemostasis achieved with:  ferric subsulfate Outcome: patient tolerated procedure well with no complications   Additional details:  Wound innoculated with 5 fluorouracil solution.  Related Medications mupirocin ointment (BACTROBAN) 2 % Apply to affected qd-bid  AK (actinic keratosis) Left Malar Cheek  Destruction of lesion - Left Malar Cheek Complexity: simple   Destruction method: cryotherapy   Informed consent: discussed and consent obtained   Timeout:  patient name, date of birth, surgical site, and procedure verified Lesion destroyed using liquid nitrogen: Yes   Cryotherapy cycles:  3 Outcome: patient tolerated procedure well with no complications        I, Lavonna Monarch, MD, have reviewed all documentation for this visit.  The documentation on 04/17/21 for the exam, diagnosis, procedures, and orders are all accurate and complete.

## 2021-04-19 DIAGNOSIS — M3500 Sicca syndrome, unspecified: Secondary | ICD-10-CM | POA: Diagnosis not present

## 2021-04-19 DIAGNOSIS — E669 Obesity, unspecified: Secondary | ICD-10-CM | POA: Diagnosis not present

## 2021-04-19 DIAGNOSIS — M2550 Pain in unspecified joint: Secondary | ICD-10-CM | POA: Diagnosis not present

## 2021-04-19 DIAGNOSIS — Z6831 Body mass index (BMI) 31.0-31.9, adult: Secondary | ICD-10-CM | POA: Diagnosis not present

## 2021-04-19 DIAGNOSIS — M159 Polyosteoarthritis, unspecified: Secondary | ICD-10-CM | POA: Diagnosis not present

## 2021-04-19 DIAGNOSIS — Z79899 Other long term (current) drug therapy: Secondary | ICD-10-CM | POA: Diagnosis not present

## 2021-04-19 DIAGNOSIS — M329 Systemic lupus erythematosus, unspecified: Secondary | ICD-10-CM | POA: Diagnosis not present

## 2021-04-19 DIAGNOSIS — M0579 Rheumatoid arthritis with rheumatoid factor of multiple sites without organ or systems involvement: Secondary | ICD-10-CM | POA: Diagnosis not present

## 2021-04-27 DIAGNOSIS — M0579 Rheumatoid arthritis with rheumatoid factor of multiple sites without organ or systems involvement: Secondary | ICD-10-CM | POA: Diagnosis not present

## 2021-04-27 DIAGNOSIS — Z79899 Other long term (current) drug therapy: Secondary | ICD-10-CM | POA: Diagnosis not present

## 2021-05-09 DIAGNOSIS — E1143 Type 2 diabetes mellitus with diabetic autonomic (poly)neuropathy: Secondary | ICD-10-CM | POA: Diagnosis not present

## 2021-05-09 DIAGNOSIS — E559 Vitamin D deficiency, unspecified: Secondary | ICD-10-CM | POA: Diagnosis not present

## 2021-05-09 DIAGNOSIS — I10 Essential (primary) hypertension: Secondary | ICD-10-CM | POA: Diagnosis not present

## 2021-05-09 DIAGNOSIS — K2100 Gastro-esophageal reflux disease with esophagitis, without bleeding: Secondary | ICD-10-CM | POA: Diagnosis not present

## 2021-05-09 DIAGNOSIS — Z1329 Encounter for screening for other suspected endocrine disorder: Secondary | ICD-10-CM | POA: Diagnosis not present

## 2021-05-09 DIAGNOSIS — D649 Anemia, unspecified: Secondary | ICD-10-CM | POA: Diagnosis not present

## 2021-05-09 DIAGNOSIS — E782 Mixed hyperlipidemia: Secondary | ICD-10-CM | POA: Diagnosis not present

## 2021-05-09 DIAGNOSIS — M329 Systemic lupus erythematosus, unspecified: Secondary | ICD-10-CM | POA: Diagnosis not present

## 2021-05-09 DIAGNOSIS — E781 Pure hyperglyceridemia: Secondary | ICD-10-CM | POA: Diagnosis not present

## 2021-05-09 DIAGNOSIS — M069 Rheumatoid arthritis, unspecified: Secondary | ICD-10-CM | POA: Diagnosis not present

## 2021-05-09 DIAGNOSIS — E1165 Type 2 diabetes mellitus with hyperglycemia: Secondary | ICD-10-CM | POA: Diagnosis not present

## 2021-05-09 DIAGNOSIS — Z0001 Encounter for general adult medical examination with abnormal findings: Secondary | ICD-10-CM | POA: Diagnosis not present

## 2021-05-09 DIAGNOSIS — E1122 Type 2 diabetes mellitus with diabetic chronic kidney disease: Secondary | ICD-10-CM | POA: Diagnosis not present

## 2021-05-09 DIAGNOSIS — E7849 Other hyperlipidemia: Secondary | ICD-10-CM | POA: Diagnosis not present

## 2021-05-09 DIAGNOSIS — N183 Chronic kidney disease, stage 3 unspecified: Secondary | ICD-10-CM | POA: Diagnosis not present

## 2021-05-10 DIAGNOSIS — Z20828 Contact with and (suspected) exposure to other viral communicable diseases: Secondary | ICD-10-CM | POA: Diagnosis not present

## 2021-05-11 DIAGNOSIS — Z7984 Long term (current) use of oral hypoglycemic drugs: Secondary | ICD-10-CM | POA: Diagnosis not present

## 2021-05-11 DIAGNOSIS — I10 Essential (primary) hypertension: Secondary | ICD-10-CM | POA: Diagnosis not present

## 2021-05-11 DIAGNOSIS — M3210 Systemic lupus erythematosus, organ or system involvement unspecified: Secondary | ICD-10-CM | POA: Diagnosis not present

## 2021-05-11 DIAGNOSIS — E1165 Type 2 diabetes mellitus with hyperglycemia: Secondary | ICD-10-CM | POA: Diagnosis not present

## 2021-05-11 DIAGNOSIS — E7849 Other hyperlipidemia: Secondary | ICD-10-CM | POA: Diagnosis not present

## 2021-05-12 DIAGNOSIS — I10 Essential (primary) hypertension: Secondary | ICD-10-CM | POA: Diagnosis not present

## 2021-05-12 DIAGNOSIS — Z23 Encounter for immunization: Secondary | ICD-10-CM | POA: Diagnosis not present

## 2021-05-12 DIAGNOSIS — E559 Vitamin D deficiency, unspecified: Secondary | ICD-10-CM | POA: Diagnosis not present

## 2021-05-12 DIAGNOSIS — E1143 Type 2 diabetes mellitus with diabetic autonomic (poly)neuropathy: Secondary | ICD-10-CM | POA: Diagnosis not present

## 2021-05-12 DIAGNOSIS — G619 Inflammatory polyneuropathy, unspecified: Secondary | ICD-10-CM | POA: Diagnosis not present

## 2021-05-12 DIAGNOSIS — E1165 Type 2 diabetes mellitus with hyperglycemia: Secondary | ICD-10-CM | POA: Diagnosis not present

## 2021-05-12 DIAGNOSIS — M3210 Systemic lupus erythematosus, organ or system involvement unspecified: Secondary | ICD-10-CM | POA: Diagnosis not present

## 2021-05-12 DIAGNOSIS — R197 Diarrhea, unspecified: Secondary | ICD-10-CM | POA: Diagnosis not present

## 2021-05-16 ENCOUNTER — Encounter (INDEPENDENT_AMBULATORY_CARE_PROVIDER_SITE_OTHER): Payer: Self-pay | Admitting: *Deleted

## 2021-06-02 ENCOUNTER — Other Ambulatory Visit: Payer: Self-pay

## 2021-06-02 ENCOUNTER — Ambulatory Visit (INDEPENDENT_AMBULATORY_CARE_PROVIDER_SITE_OTHER): Payer: Medicare Other | Admitting: Dermatology

## 2021-06-02 ENCOUNTER — Encounter: Payer: Self-pay | Admitting: Dermatology

## 2021-06-02 DIAGNOSIS — C44319 Basal cell carcinoma of skin of other parts of face: Secondary | ICD-10-CM

## 2021-06-02 DIAGNOSIS — D0472 Carcinoma in situ of skin of left lower limb, including hip: Secondary | ICD-10-CM

## 2021-06-02 DIAGNOSIS — D099 Carcinoma in situ, unspecified: Secondary | ICD-10-CM

## 2021-06-02 DIAGNOSIS — M0579 Rheumatoid arthritis with rheumatoid factor of multiple sites without organ or systems involvement: Secondary | ICD-10-CM | POA: Diagnosis not present

## 2021-06-02 NOTE — Patient Instructions (Signed)

## 2021-06-09 DIAGNOSIS — Z20828 Contact with and (suspected) exposure to other viral communicable diseases: Secondary | ICD-10-CM | POA: Diagnosis not present

## 2021-06-18 DIAGNOSIS — R059 Cough, unspecified: Secondary | ICD-10-CM | POA: Diagnosis not present

## 2021-06-18 DIAGNOSIS — Z6830 Body mass index (BMI) 30.0-30.9, adult: Secondary | ICD-10-CM | POA: Diagnosis not present

## 2021-06-18 DIAGNOSIS — J0101 Acute recurrent maxillary sinusitis: Secondary | ICD-10-CM | POA: Diagnosis not present

## 2021-06-30 DIAGNOSIS — M0579 Rheumatoid arthritis with rheumatoid factor of multiple sites without organ or systems involvement: Secondary | ICD-10-CM | POA: Diagnosis not present

## 2021-07-01 DIAGNOSIS — H47093 Other disorders of optic nerve, not elsewhere classified, bilateral: Secondary | ICD-10-CM | POA: Diagnosis not present

## 2021-07-02 ENCOUNTER — Encounter: Payer: Self-pay | Admitting: Dermatology

## 2021-07-02 NOTE — Progress Notes (Signed)
° °  Follow-Up Visit   Subjective  [name] is a 71 y.o. female who presents for the following: Procedure (Left posterior mandible nod. bcc).  BCC left cheek Location:  Duration:  Quality:  Associated Signs/Symptoms: Modifying Factors:  Severity:  Timing: Context:   Objective  Well appearing patient in no apparent distress; mood and affect are within normal limits. Left Lower Leg - Posterior Some scale on the R shin - superficial scca       Left Posterior Mandible Biopsy site found by nurse and me    A focused examination was performed including head, neck, legs.. Relevant physical exam findings are noted in the Assessment and Plan.   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  Will re evaluate in 3 months   Basal cell carcinoma (BCC) of left lateral cheek Left Posterior Mandible  Destruction of lesion Complexity: simple   Destruction method: electrodesiccation and curettage   Informed consent: discussed and consent obtained   Timeout:  patient name, date of birth, surgical site, and procedure verified Anesthesia: the lesion was anesthetized in a standard fashion   Anesthetic:  1% lidocaine w/ epinephrine 1-100,000 local infiltration Curettage performed in three different directions: Yes   Curettage cycles:  3 Lesion length (cm):  0.9 Lesion width (cm):  0.9 Margin per side (cm):  0 Final wound size (cm):  0.9 Hemostasis achieved with:  ferric subsulfate Outcome: patient tolerated procedure well with no complications   Post-procedure details: sterile dressing applied and wound care instructions given   Dressing type: bandage and petrolatum   Additional details:  Wound inoculated with 5% Fluorouracil.        I, Lavonna Monarch, MD, have reviewed all documentation for this visit.  The documentation on 07/02/21 for the exam, diagnosis, procedures, and orders are all accurate and complete.

## 2021-07-10 DIAGNOSIS — I10 Essential (primary) hypertension: Secondary | ICD-10-CM | POA: Diagnosis not present

## 2021-07-10 DIAGNOSIS — E7849 Other hyperlipidemia: Secondary | ICD-10-CM | POA: Diagnosis not present

## 2021-07-10 DIAGNOSIS — E1165 Type 2 diabetes mellitus with hyperglycemia: Secondary | ICD-10-CM | POA: Diagnosis not present

## 2021-07-13 ENCOUNTER — Ambulatory Visit: Payer: Medicare Other | Admitting: Dermatology

## 2021-07-28 DIAGNOSIS — Z79899 Other long term (current) drug therapy: Secondary | ICD-10-CM | POA: Diagnosis not present

## 2021-07-28 DIAGNOSIS — M0579 Rheumatoid arthritis with rheumatoid factor of multiple sites without organ or systems involvement: Secondary | ICD-10-CM | POA: Diagnosis not present

## 2021-08-09 ENCOUNTER — Ambulatory Visit: Payer: Medicare Other | Admitting: Neurology

## 2021-08-11 DIAGNOSIS — Z20828 Contact with and (suspected) exposure to other viral communicable diseases: Secondary | ICD-10-CM | POA: Diagnosis not present

## 2021-08-30 ENCOUNTER — Ambulatory Visit: Payer: Medicare Other | Admitting: Dermatology

## 2021-08-30 DIAGNOSIS — M0579 Rheumatoid arthritis with rheumatoid factor of multiple sites without organ or systems involvement: Secondary | ICD-10-CM | POA: Diagnosis not present

## 2021-09-07 DIAGNOSIS — N183 Chronic kidney disease, stage 3 unspecified: Secondary | ICD-10-CM | POA: Diagnosis not present

## 2021-09-07 DIAGNOSIS — K2100 Gastro-esophageal reflux disease with esophagitis, without bleeding: Secondary | ICD-10-CM | POA: Diagnosis not present

## 2021-09-07 DIAGNOSIS — Z1329 Encounter for screening for other suspected endocrine disorder: Secondary | ICD-10-CM | POA: Diagnosis not present

## 2021-09-07 DIAGNOSIS — I10 Essential (primary) hypertension: Secondary | ICD-10-CM | POA: Diagnosis not present

## 2021-09-07 DIAGNOSIS — E559 Vitamin D deficiency, unspecified: Secondary | ICD-10-CM | POA: Diagnosis not present

## 2021-09-07 DIAGNOSIS — E1165 Type 2 diabetes mellitus with hyperglycemia: Secondary | ICD-10-CM | POA: Diagnosis not present

## 2021-09-07 DIAGNOSIS — E1143 Type 2 diabetes mellitus with diabetic autonomic (poly)neuropathy: Secondary | ICD-10-CM | POA: Diagnosis not present

## 2021-09-07 DIAGNOSIS — E1122 Type 2 diabetes mellitus with diabetic chronic kidney disease: Secondary | ICD-10-CM | POA: Diagnosis not present

## 2021-09-12 DIAGNOSIS — I10 Essential (primary) hypertension: Secondary | ICD-10-CM | POA: Diagnosis not present

## 2021-09-12 DIAGNOSIS — M3210 Systemic lupus erythematosus, organ or system involvement unspecified: Secondary | ICD-10-CM | POA: Diagnosis not present

## 2021-09-12 DIAGNOSIS — D649 Anemia, unspecified: Secondary | ICD-10-CM | POA: Diagnosis not present

## 2021-09-12 DIAGNOSIS — E1165 Type 2 diabetes mellitus with hyperglycemia: Secondary | ICD-10-CM | POA: Diagnosis not present

## 2021-09-12 DIAGNOSIS — E1143 Type 2 diabetes mellitus with diabetic autonomic (poly)neuropathy: Secondary | ICD-10-CM | POA: Diagnosis not present

## 2021-09-12 DIAGNOSIS — M069 Rheumatoid arthritis, unspecified: Secondary | ICD-10-CM | POA: Diagnosis not present

## 2021-09-12 DIAGNOSIS — K580 Irritable bowel syndrome with diarrhea: Secondary | ICD-10-CM | POA: Diagnosis not present

## 2021-09-29 DIAGNOSIS — M0579 Rheumatoid arthritis with rheumatoid factor of multiple sites without organ or systems involvement: Secondary | ICD-10-CM | POA: Diagnosis not present

## 2021-09-29 DIAGNOSIS — Z79899 Other long term (current) drug therapy: Secondary | ICD-10-CM | POA: Diagnosis not present

## 2021-10-20 DIAGNOSIS — M3500 Sicca syndrome, unspecified: Secondary | ICD-10-CM | POA: Diagnosis not present

## 2021-10-20 DIAGNOSIS — M329 Systemic lupus erythematosus, unspecified: Secondary | ICD-10-CM | POA: Diagnosis not present

## 2021-10-20 DIAGNOSIS — M0579 Rheumatoid arthritis with rheumatoid factor of multiple sites without organ or systems involvement: Secondary | ICD-10-CM | POA: Diagnosis not present

## 2021-10-20 DIAGNOSIS — Z6832 Body mass index (BMI) 32.0-32.9, adult: Secondary | ICD-10-CM | POA: Diagnosis not present

## 2021-10-20 DIAGNOSIS — Z79899 Other long term (current) drug therapy: Secondary | ICD-10-CM | POA: Diagnosis not present

## 2021-10-20 DIAGNOSIS — E669 Obesity, unspecified: Secondary | ICD-10-CM | POA: Diagnosis not present

## 2021-10-27 DIAGNOSIS — M0579 Rheumatoid arthritis with rheumatoid factor of multiple sites without organ or systems involvement: Secondary | ICD-10-CM | POA: Diagnosis not present

## 2021-11-11 ENCOUNTER — Encounter (INDEPENDENT_AMBULATORY_CARE_PROVIDER_SITE_OTHER): Payer: Self-pay | Admitting: *Deleted

## 2021-11-28 DIAGNOSIS — M0579 Rheumatoid arthritis with rheumatoid factor of multiple sites without organ or systems involvement: Secondary | ICD-10-CM | POA: Diagnosis not present

## 2021-12-09 DIAGNOSIS — E7849 Other hyperlipidemia: Secondary | ICD-10-CM | POA: Diagnosis not present

## 2021-12-09 DIAGNOSIS — K2100 Gastro-esophageal reflux disease with esophagitis, without bleeding: Secondary | ICD-10-CM | POA: Diagnosis not present

## 2021-12-09 DIAGNOSIS — I10 Essential (primary) hypertension: Secondary | ICD-10-CM | POA: Diagnosis not present

## 2021-12-09 DIAGNOSIS — E782 Mixed hyperlipidemia: Secondary | ICD-10-CM | POA: Diagnosis not present

## 2021-12-09 DIAGNOSIS — D72819 Decreased white blood cell count, unspecified: Secondary | ICD-10-CM | POA: Diagnosis not present

## 2021-12-09 DIAGNOSIS — D649 Anemia, unspecified: Secondary | ICD-10-CM | POA: Diagnosis not present

## 2021-12-09 DIAGNOSIS — E1165 Type 2 diabetes mellitus with hyperglycemia: Secondary | ICD-10-CM | POA: Diagnosis not present

## 2021-12-09 DIAGNOSIS — E559 Vitamin D deficiency, unspecified: Secondary | ICD-10-CM | POA: Diagnosis not present

## 2021-12-14 DIAGNOSIS — I10 Essential (primary) hypertension: Secondary | ICD-10-CM | POA: Diagnosis not present

## 2021-12-14 DIAGNOSIS — G629 Polyneuropathy, unspecified: Secondary | ICD-10-CM | POA: Diagnosis not present

## 2021-12-14 DIAGNOSIS — D696 Thrombocytopenia, unspecified: Secondary | ICD-10-CM | POA: Diagnosis not present

## 2021-12-14 DIAGNOSIS — E7849 Other hyperlipidemia: Secondary | ICD-10-CM | POA: Diagnosis not present

## 2021-12-14 DIAGNOSIS — K580 Irritable bowel syndrome with diarrhea: Secondary | ICD-10-CM | POA: Diagnosis not present

## 2021-12-14 DIAGNOSIS — M069 Rheumatoid arthritis, unspecified: Secondary | ICD-10-CM | POA: Diagnosis not present

## 2021-12-14 DIAGNOSIS — E1143 Type 2 diabetes mellitus with diabetic autonomic (poly)neuropathy: Secondary | ICD-10-CM | POA: Diagnosis not present

## 2021-12-14 DIAGNOSIS — E1165 Type 2 diabetes mellitus with hyperglycemia: Secondary | ICD-10-CM | POA: Diagnosis not present

## 2021-12-14 DIAGNOSIS — R5383 Other fatigue: Secondary | ICD-10-CM | POA: Diagnosis not present

## 2021-12-14 DIAGNOSIS — D649 Anemia, unspecified: Secondary | ICD-10-CM | POA: Diagnosis not present

## 2021-12-14 DIAGNOSIS — D72819 Decreased white blood cell count, unspecified: Secondary | ICD-10-CM | POA: Diagnosis not present

## 2021-12-15 ENCOUNTER — Encounter (INDEPENDENT_AMBULATORY_CARE_PROVIDER_SITE_OTHER): Payer: Self-pay | Admitting: *Deleted

## 2022-01-04 DIAGNOSIS — Z6831 Body mass index (BMI) 31.0-31.9, adult: Secondary | ICD-10-CM | POA: Diagnosis not present

## 2022-01-04 DIAGNOSIS — H00019 Hordeolum externum unspecified eye, unspecified eyelid: Secondary | ICD-10-CM | POA: Diagnosis not present

## 2022-01-17 DIAGNOSIS — Z79899 Other long term (current) drug therapy: Secondary | ICD-10-CM | POA: Diagnosis not present

## 2022-01-17 DIAGNOSIS — M0579 Rheumatoid arthritis with rheumatoid factor of multiple sites without organ or systems involvement: Secondary | ICD-10-CM | POA: Diagnosis not present

## 2022-01-17 DIAGNOSIS — R5383 Other fatigue: Secondary | ICD-10-CM | POA: Diagnosis not present

## 2022-01-17 DIAGNOSIS — M329 Systemic lupus erythematosus, unspecified: Secondary | ICD-10-CM | POA: Diagnosis not present

## 2022-01-17 DIAGNOSIS — Z111 Encounter for screening for respiratory tuberculosis: Secondary | ICD-10-CM | POA: Diagnosis not present

## 2022-01-19 ENCOUNTER — Other Ambulatory Visit (INDEPENDENT_AMBULATORY_CARE_PROVIDER_SITE_OTHER): Payer: Self-pay

## 2022-01-19 ENCOUNTER — Encounter (INDEPENDENT_AMBULATORY_CARE_PROVIDER_SITE_OTHER): Payer: Self-pay

## 2022-01-19 ENCOUNTER — Telehealth (INDEPENDENT_AMBULATORY_CARE_PROVIDER_SITE_OTHER): Payer: Self-pay

## 2022-01-19 ENCOUNTER — Ambulatory Visit (INDEPENDENT_AMBULATORY_CARE_PROVIDER_SITE_OTHER): Payer: Medicare Other | Admitting: Gastroenterology

## 2022-01-19 ENCOUNTER — Encounter (INDEPENDENT_AMBULATORY_CARE_PROVIDER_SITE_OTHER): Payer: Self-pay | Admitting: Gastroenterology

## 2022-01-19 VITALS — BP 146/82 | HR 77 | Temp 97.7°F | Ht 66.0 in | Wt 204.8 lb

## 2022-01-19 DIAGNOSIS — R1310 Dysphagia, unspecified: Secondary | ICD-10-CM | POA: Diagnosis not present

## 2022-01-19 DIAGNOSIS — R197 Diarrhea, unspecified: Secondary | ICD-10-CM

## 2022-01-19 MED ORDER — PEG 3350-KCL-NA BICARB-NACL 420 G PO SOLR: 4000.0000 mL | ORAL | 0 refills | Status: DC

## 2022-01-19 NOTE — H&P (View-Only) (Signed)
Referring Provider: Manon Hilding, MD Primary Care Physician:  Manon Hilding, MD Primary GI Physician: Jenetta Downer   Chief Complaint  Patient presents with   Diarrhea    Patient here today with complaints of diarrhea on going for some time now, thinks related to some of her medications, says pcp advised to come in and be evaluated for a TCS. She says she had her last TCS at [facility] a few that could have been 7-10 years ago. She also is having issues with swallowing, especially with dry foods.   HPI:   [name] is a 71 y.o. female with past medical history of  lupus,hld, rheumatoid arthritis on Tocilizumab, hypertension, diabetes, IBS,  Patient presenting today for diarrhea and dysphagia.  History: Last seen 12/22/19 for abdominal pain and diarrhea. At that time she reported loose to watery BMs x3 years with 3-4 BMs per day as well as urgency and tenesmus. Taking pepto bismol with some improvement, imodium caused constipation. Diarrhea more prevalent with salads or processed foods. Alsow with some left sided abdominal pain since fall of 2020. Dysphagia to solid foods x1 year that felt improved with PPI.   Advised to discuss use of prozac with PCP as potential cause of diarrhea, scheduled for EGD, low fodmap diet given, barium pill esophagram,  prn imodium and MRI of abdomen for previously noted liver lesions.   EGD was never completed, barium pill esophagram WNL, MRI with multiple benign hepatic hemangiomas, stable.   Last labs in June with PCP with normal hgb, LFTs and electrolytes  TSH in August 2022 was 0.921  Present: Patient reports ongoing diarrhea and upset stomach for years, though worse recently with more fecal urgency and some fecal incontinence. She is having loose to watery stools. She may go up to 4-5x per day, other days she may go less. She does have intermittent formed stools, she may have this for 2-3 days and then go back to diarrhea. She denies weight loss.  Does note that diarrhea seems to be shortly after eating. She will use pepto bismol as needed if she is going out which helps some. Abdominal cramping is usually in mid to lower abdomen. Sometimes has tenesmus and feels that she cannot emptying her bowels well. Denies rectal bleeding, sometimes has darker stools after taking pepto bismol.   She states previously with some dysphagia and was going to have EGD but got sick so she had to cancel and then felt that symptoms Improved some. Has some occasional dysphagia where food feels stuck in upper chest. Usually can get food to pass. Tends to happen more with drier, thicker foods. This occurs only a few times per month. Appetite is good. Denies early satiety. Denies vomiting, has very occasional vomiting. Maintained on pantoprazole '40mg'$  daily, sometimes takes a second dose in the evenings, occasionally will need tums to help with breakthrough symptoms.   MR Liver: 01/13/20: No acute findings. Multiple benign hepatic hemangiomas remain stable.Stable tiny benign right adrenal adenoma. Esophagram: 12/24/19: No abnormality seen in the esophagus.  Last Colonoscopy: 7 years ago at [facility], no polyps per patient Last Endoscopy:never  Recommendations:    Past Medical History:  Diagnosis Date   Atypical mole 11/19/2012   Mid Right Back (moderate to severe) (wider shave)   Basal cell carcinoma    Basal cell carcinoma 01/25/2021   sup-left lower leg (CX35FU)   BCC (basal cell carcinoma of skin) 09/17/2013   Left Inner Lower Leg (Ulcerated) (curet and excision)  Cerebral aneurysm without rupture 09/11/2018   2.5 mm supraclinoid left carotid artery   Complication of anesthesia    difficult to arouse   Diabetes (HCC)    IBS (irritable bowel syndrome)    Lupus (HCC)    Migraine headache 09/11/2018   Neuropathy    SCC (squamous cell carcinoma) 01/25/2021   in situ- right lower inner leg(CX35FU)   Sleep apnea    Squamous cell carcinoma of skin 10/27/2015    Left Inner Calf (in situ) (curet, 5FU and cautery)   Superficial basal cell carcinoma (BCC) 10/14/2008   Left Inner Lower Leg (Zyclara, LN2)   Superficial basal cell carcinoma (BCC) 10/14/2008   Left Post Inf Calf (Zyclara, LN2)   Superficial basal cell carcinoma (BCC) 10/14/2008   Left Post Sup Calf (Zyclara, LN2)   Superficial basal cell carcinoma (BCC) 09/17/2013   Left Post Inf Calf (curet and 5FU)   Tremor 02/08/2021    Past Surgical History:  Procedure Laterality Date   BIOPSY BREAST Right    negative   GALLBLADDER SURGERY     TENDON EXPLORATION     arms/hand    Current Outpatient Medications  Medication Sig Dispense Refill   ACETAMINOPHEN PO Take 650 mg by mouth.     bismuth subsalicylate (PEPTO BISMOL) 262 MG/15ML suspension Take 15-20 mLs by mouth daily as needed (upset stomach).      calcium carbonate (TUMS - DOSED IN MG ELEMENTAL CALCIUM) 500 MG chewable tablet Chew 2 tablets by mouth 2 (two) times daily as needed for indigestion or heartburn.     Cholecalciferol (VITAMIN D3) 50 MCG (2000 UT) TABS Take 2,000 Units by mouth daily.     FLUoxetine (PROZAC) 20 MG capsule Take 20 mg by mouth daily.  2   hydroxychloroquine (PLAQUENIL) 200 MG tablet Take 200 mg by mouth 2 (two) times daily.     lisinopril-hydrochlorothiazide (PRINZIDE,ZESTORETIC) 10-12.5 MG tablet Take 1 tablet by mouth daily.     pantoprazole (PROTONIX) 40 MG tablet Take 40 mg by mouth 2 (two) times daily.     pioglitazone (ACTOS) 15 MG tablet Take 15 mg by mouth daily.  6   tocilizumab (ACTEMRA) 400 MG/20ML SOLN injection Inject 400 mg into the vein every 30 (thirty) days.      topiramate (TOPAMAX) 25 MG tablet Take 3 tablets (75 mg total) by mouth at bedtime. (Patient taking differently: Take 50 mg by mouth at bedtime.) 270 tablet 3   zolpidem (AMBIEN) 10 MG tablet Take 10 mg by mouth at bedtime.     clotrimazole (MYCELEX) 10 MG troche Take 10 mg by mouth 5 (five) times daily. (Patient not taking:  Reported on 01/19/2022)     No current facility-administered medications for this visit.    Allergies as of 01/19/2022 - Review Complete 01/19/2022  Allergen Reaction Noted   Clindamycin/lincomycin Hives 09/07/2017   Mestinon [pyridostigmine]  02/08/2021   Sulfa antibiotics Nausea And Vomiting 09/07/2017    Family History  Problem Relation Age of Onset   Cerebral aneurysm Mother    High blood pressure Mother    Thyroid disease Mother    COPD Father     Social History   Socioeconomic History   Marital status: Married    Spouse name: Jeneen Rinks   Number of children: 2   Years of education: Some college   Highest education level: Not on file  Occupational History   Occupation: Retired  Tobacco Use   Smoking status: Former   Smokeless tobacco: Never  Vaping Use   Vaping Use: Never used  Substance and Sexual Activity   Alcohol use: No   Drug use: No   Sexual activity: Not on file  Other Topics Concern   Not on file  Social History Narrative   Lives with husband   Caffeine use: Coffee (2 cups per day)   2 glasses tea per day in summer   Soda sometimes   Right handed   Social Determinants of Health   Financial Resource Strain: Not on file  Food Insecurity: Not on file  Transportation Needs: Not on file  Physical Activity: Not on file  Stress: Not on file  Social Connections: Not on file   Review of systems General: negative for malaise, night sweats, fever, chills, weight loss Neck: Negative for lumps, goiter, pain and significant neck swelling Resp: Negative for cough, wheezing, dyspnea at rest CV: Negative for chest pain, leg swelling, palpitations, orthopnea GI: denies melena, hematochezia, nausea, vomiting, constipation, odynophagia, early satiety or unintentional weight loss. +diarrhea +dysphagia MSK: Negative for joint pain or swelling, back pain, and muscle pain. Derm: Negative for itching or rash Psych: Denies depression, anxiety, memory loss, confusion. No  homicidal or suicidal ideation.  Heme: Negative for prolonged bleeding, bruising easily, and swollen nodes. Endocrine: Negative for cold or heat intolerance, polyuria, polydipsia and goiter. Neuro: negative for tremor, gait imbalance, syncope and seizures. The remainder of the review of systems is noncontributory.  Physical Exam: BP (!) 146/82 (BP Location: Left Arm, Patient Position: Sitting, Cuff Size: Large)   Pulse 77   Temp 97.7 F (36.5 C) (Oral)   Ht '5\' 6"'$  (1.676 m)   Wt 204 lb 12.8 oz (92.9 kg)   BMI 33.06 kg/m  General:   Alert and oriented. No distress noted. Pleasant and cooperative.  Head:  Normocephalic and atraumatic. Eyes:  Conjuctiva clear without scleral icterus. Mouth:  Oral mucosa pink and moist. Good dentition. No lesions. Heart: Normal rate and rhythm, s1 and s2 heart sounds present.  Lungs: Clear lung sounds in all lobes. Respirations equal and unlabored. Abdomen:  +BS, soft, non-tender and non-distended. No rebound or guarding. No HSM or masses noted. Derm: No palmar erythema or jaundice Msk:  Symmetrical without gross deformities. Normal posture. Extremities:  Without edema. Neurologic:  Alert and  oriented x4 Psych:  Alert and cooperative. Normal mood and affect.  Invalid input(s): "6 MONTHS"   ASSESSMENT: ANIKKA MARSAN is a 71 y.o. female presenting today for diarrhea and dysphagia.  Presents today with ongoing diarrhea and dysphagia. Diarrhea has been ongoing for years though worse recently with up 5-6 watery stools per day, intermittent formed stools. Denies rectal bleeding, melena or weight loss. Previously scheduled for colonoscopy with random colonic biopsies and celiac testing however this had to be cancelled due to patient being ill. Recommend proceeding with endoscopic evaluation of diarrhea. Will start bentyl '10mg'$  TID PRN and implement low FODMAP diet.   She continues to have some intermittent dysphagia, usually with thicker, drier foods. Some  GERD symptoms with the need to repeat PPI dosing some days. Recommend proceeding with EGD for further evaluation as we cannot rule out esophageal ring, web, stricture or stenosis. She should chew thoroughly, take small bites, sips of liquids between bites and avoid thicker,drier foods.   Indications, risks and benefits of procedure discussed in detail with patient. Patient verbalized understanding and is in agreement to proceed with EGD/colonoscopy at this time.    PLAN:  Schedule colonoscopy and EGD with random  colonic biopsies/celiac testing, ASA II 2. Rx bentyl '10mg'$  TID PRN  3.  Low FODMAP, keep stool journal  4. Chewing precautions  All questions were answered, patient verbalized understanding and is in agreement with plan as outlined above.    Follow Up: 3 months   Taralyn Ferraiolo L. Alver Sorrow, MSN, APRN, AGNP-C Adult-Gerontology Nurse Practitioner [facility]

## 2022-01-19 NOTE — Telephone Encounter (Signed)
Dakota Vanwart Ann Guenther [place], CMA  ?

## 2022-01-19 NOTE — Patient Instructions (Signed)
We will schedule EGD and Colonoscopy  I have sent bentyl '10mg'$ , you can take this up to three times per day, try to take this about 30 minute prior to eating I am providing the low FODMAP diet, please try to keep a food/stool journal as well to help correlate if there are any specific triggers for you  Follow up 3 months

## 2022-01-19 NOTE — Progress Notes (Signed)
Referring Provider: Manon Hilding, MD Primary Care Physician:  Manon Hilding, MD Primary GI Physician: Jenetta Downer   Chief Complaint  Patient presents with   Diarrhea    Patient here today with complaints of diarrhea on going for some time now, thinks related to some of her medications, says pcp advised to come in and be evaluated for a TCS. She says she had her last TCS at [facility] a few that could have been 7-10 years ago. She also is having issues with swallowing, especially with dry foods.   HPI:   [name] is a 71 y.o. female with past medical history of  lupus,hld, rheumatoid arthritis on Tocilizumab, hypertension, diabetes, IBS,  Patient presenting today for diarrhea and dysphagia.  History: Last seen 12/22/19 for abdominal pain and diarrhea. At that time she reported loose to watery BMs x3 years with 3-4 BMs per day as well as urgency and tenesmus. Taking pepto bismol with some improvement, imodium caused constipation. Diarrhea more prevalent with salads or processed foods. [place] with some left sided abdominal pain since fall of 2020. Dysphagia to solid foods x1 year that felt improved with PPI.   Advised to discuss use of prozac with PCP as potential cause of diarrhea, scheduled for EGD, low fodmap diet given, barium pill esophagram,  prn imodium and MRI of abdomen for previously noted liver lesions.   EGD was never completed, barium pill esophagram WNL, MRI with multiple benign hepatic hemangiomas, stable.   Last labs in June with PCP with normal hgb, LFTs and electrolytes  TSH in August 2022 was 0.921  Present: Patient reports ongoing diarrhea and upset stomach for years, though worse recently with more fecal urgency and some fecal incontinence. She is having loose to watery stools. She may go up to 4-5x per day, other days she may go less. She does have intermittent formed stools, she may have this for 2-3 days and then go back to diarrhea. She denies weight loss.  Does note that diarrhea seems to be shortly after eating. She will use pepto bismol as needed if she is going out which helps some. Abdominal cramping is usually in mid to lower abdomen. Sometimes has tenesmus and feels that she cannot emptying her bowels well. Denies rectal bleeding, sometimes has darker stools after taking pepto bismol.   She states previously with some dysphagia and was going to have EGD but got sick so she had to cancel and then felt that symptoms Improved some. Has some occasional dysphagia where food feels stuck in upper chest. Usually can get food to pass. Tends to happen more with drier, thicker foods. This occurs only a few times per month. Appetite is good. Denies early satiety. Denies vomiting, has very occasional vomiting. Maintained on pantoprazole '40mg'$  daily, sometimes takes a second dose in the evenings, occasionally will need tums to help with breakthrough symptoms.   MR Liver: 01/13/20: No acute findings. Multiple benign hepatic hemangiomas remain stable.Stable tiny benign right adrenal adenoma. Esophagram: 12/24/19: No abnormality seen in the esophagus.  Last Colonoscopy: 7 years ago at [facility], no polyps per patient Last Endoscopy:never  Recommendations:    Past Medical History:  Diagnosis Date   Atypical mole 11/19/2012   Mid Right Back (moderate to severe) (wider shave)   Basal cell carcinoma    Basal cell carcinoma 01/25/2021   sup-left lower leg (CX35FU)   BCC (basal cell carcinoma of skin) 09/17/2013   Left Inner Lower Leg (Ulcerated) (curet and excision)  Cerebral aneurysm without rupture 09/11/2018   2.5 mm supraclinoid left carotid artery   Complication of anesthesia    difficult to arouse   Diabetes (HCC)    IBS (irritable bowel syndrome)    Lupus (HCC)    Migraine headache 09/11/2018   Neuropathy    SCC (squamous cell carcinoma) 01/25/2021   in situ- right lower inner leg(CX35FU)   Sleep apnea    Squamous cell carcinoma of skin 10/27/2015    Left Inner Calf (in situ) (curet, 5FU and cautery)   Superficial basal cell carcinoma (BCC) 10/14/2008   Left Inner Lower Leg ([place]ycl[place], LN2)   Superficial basal cell carcinoma (BCC) 10/14/2008   Left Post Inf Calf (Zyclara, LN2)   Superficial basal cell carcinoma (BCC) 10/14/2008   Left Post Sup Calf (Zyclara, LN2)   Superficial basal cell carcinoma (BCC) 09/17/2013   Left Post Inf Calf (curet and 5FU)   Tremor 02/08/2021    Past Surgical History:  Procedure Laterality Date   BIOPSY BREAST Right    negative   GALLBLADDER SURGERY     TENDON EXPLORATION     arms/hand    Current Outpatient Medications  Medication Sig Dispense Refill   ACETAMINOPHEN PO Take 650 mg by mouth.     bismuth subsalicylate (PEPTO BISMOL) 262 MG/15ML suspension Take 15-20 mLs by mouth daily as needed (upset stomach).      calcium carbonate (TUMS - DOSED IN MG ELEMENTAL CALCIUM) 500 MG chewable tablet Chew 2 tablets by mouth 2 (two) times daily as needed for indigestion or heartburn.     Cholecalciferol (VITAMIN D3) 50 MCG (2000 UT) TABS Take 2,000 Units by mouth daily.     FLUoxetine (PROZAC) 20 MG capsule Take 20 mg by mouth daily.  2   hydroxychloroquine (PLAQUENIL) 200 MG tablet Take 200 mg by mouth 2 (two) times daily.     lisinopril-hydrochlorothiazide (PRINZIDE,ZESTORETIC) 10-12.5 MG tablet Take 1 tablet by mouth daily.     pantoprazole (PROTONIX) 40 MG tablet Take 40 mg by mouth 2 (two) times daily.     pioglitazone (ACTOS) 15 MG tablet Take 15 mg by mouth daily.  6   tocilizumab (ACTEMRA) 400 MG/20ML SOLN injection Inject 400 mg into the vein every 30 (thirty) days.      topiramate (TOPAMAX) 25 MG tablet Take 3 tablets (75 mg total) by mouth at bedtime. (Patient taking differently: Take 50 mg by mouth at bedtime.) 270 tablet 3   zolpidem (AMBIEN) 10 MG tablet Take 10 mg by mouth at bedtime.     clotrimazole (MYCELEX) 10 MG troche Take 10 mg by mouth 5 (five) times daily. (Patient not taking:  Reported on 01/19/2022)     No current facility-administered medications for this visit.    Allergies as of 01/19/2022 - Review Complete 01/19/2022  Allergen Reaction Noted   Clindamycin/lincomycin Hives 09/07/2017   Mestinon [pyridostigmine]  02/08/2021   Sulfa antibiotics Nausea And Vomiting 09/07/2017    Family History  Problem Relation Age of Onset   Cerebral aneurysm Mother    High blood pressure Mother    Thyroid disease Mother    COPD Father     Social History   Socioeconomic History   Marital status: Married    Spouse name: Jeneen Rinks   Number of children: 2   Years of education: Some college   Highest education level: Not on file  Occupational History   Occupation: Retired  Tobacco Use   Smoking status: Former   Smokeless tobacco: Never  Vaping Use   Vaping Use: Never used  Substance and Sexual Activity   Alcohol use: No   Drug use: No   Sexual activity: Not on file  Other Topics Concern   Not on file  Social History Narrative   Lives with husband   Caffeine use: Coffee (2 cups per day)   2 glasses tea per day in summer   Soda sometimes   Right handed   Social Determinants of Health   Financial Resource Strain: Not on file  Food Insecurity: Not on file  Transportation Needs: Not on file  Physical Activity: Not on file  Stress: Not on file  Social Connections: Not on file   Review of systems General: negative for malaise, night sweats, fever, chills, weight loss Neck: Negative for lumps, goiter, pain and significant neck swelling Resp: Negative for cough, wheezing, dyspnea at rest CV: Negative for chest pain, leg swelling, palpitations, orthopnea GI: denies melena, hematochezia, nausea, vomiting, constipation, odynophagia, early satiety or unintentional weight loss. +diarrhea +dysphagia MSK: Negative for joint pain or swelling, back pain, and muscle pain. Derm: Negative for itching or rash Psych: Denies depression, anxiety, memory loss, confusion. No  homicidal or suicidal ideation.  Heme: Negative for prolonged bleeding, bruising easily, and swollen nodes. Endocrine: Negative for cold or heat intolerance, polyuria, polydipsia and goiter. Neuro: negative for tremor, gait imbalance, syncope and seizures. The remainder of the review of systems is noncontributory.  Physical Exam: BP (!) 146/82 (BP Location: Left Arm, Patient Position: Sitting, Cuff Size: Large)   Pulse 77   Temp 97.7 F (36.5 C) (Oral)   Ht '5\' 6"'$  (1.676 m)   Wt 204 lb 12.8 oz (92.9 kg)   BMI 33.06 kg/m  General:   Alert and oriented. No distress noted. Pleasant and cooperative.  Head:  Normocephalic and atraumatic. Eyes:  Conjuctiva clear without scleral icterus. Mouth:  Oral mucosa pink and moist. Good dentition. No lesions. Heart: Normal rate and rhythm, s1 and s2 heart sounds present.  Lungs: Clear lung sounds in all lobes. Respirations equal and unlabored. Abdomen:  +BS, soft, non-tender and non-distended. No rebound or guarding. No HSM or masses noted. Derm: No palmar erythema or jaundice Msk:  Symmetrical without gross deformities. Normal posture. Extremities:  Without edema. Neurologic:  Alert and  oriented x4 Psych:  Alert and cooperative. Normal mood and affect.  Invalid input(s): "6 MONTHS"   ASSESSMENT: [name] is a 71 y.o. female presenting today for diarrhea and dysphagia.  Presents today with ongoing diarrhea and dysphagia. Diarrhea has been ongoing for years though worse recently with up 5-6 watery stools per day, intermittent formed stools. Denies rectal bleeding, melena or weight loss. Previously scheduled for colonoscopy with random colonic biopsies and celiac testing however this had to be cancelled due to patient being ill. Recommend proceeding with endoscopic evaluation of diarrhea. Will start bentyl '10mg'$  TID PRN and implement low FODMAP diet.   She continues to have some intermittent dysphagia, usually with thicker, drier foods. Some  GERD symptoms with the need to repeat PPI dosing some days. Recommend proceeding with EGD for further evaluation as we cannot rule out esophageal ring, web, stricture or stenosis. She should chew thoroughly, take small bites, sips of liquids between bites and avoid thicker,drier foods.   Indications, risks and benefits of procedure discussed in detail with patient. Patient verbalized understanding and is in agreement to proceed with EGD/colonoscopy at this time.    PLAN:  Schedule colonoscopy and EGD with random  colonic biopsies/celiac testing, ASA II 2. Rx bentyl '10mg'$  TID PRN  3.  Low FODMAP, keep stool journal  4. Chewing precautions  All questions were answered, patient verbalized understanding and is in agreement with plan as outlined above.    Follow Up: 3 months   Verlan Grotz L. Alver Sorrow, MSN, APRN, AGNP-C Adult-Gerontology Nurse Practitioner [facility]

## 2022-01-25 ENCOUNTER — Ambulatory Visit: Payer: Medicare Other | Admitting: Dermatology

## 2022-01-31 ENCOUNTER — Telehealth (INDEPENDENT_AMBULATORY_CARE_PROVIDER_SITE_OTHER): Payer: Self-pay | Admitting: *Deleted

## 2022-01-31 ENCOUNTER — Other Ambulatory Visit (INDEPENDENT_AMBULATORY_CARE_PROVIDER_SITE_OTHER): Payer: Self-pay | Admitting: *Deleted

## 2022-01-31 ENCOUNTER — Other Ambulatory Visit (INDEPENDENT_AMBULATORY_CARE_PROVIDER_SITE_OTHER): Payer: Self-pay | Admitting: Gastroenterology

## 2022-01-31 MED ORDER — DICYCLOMINE HCL 10 MG PO CAPS: 10.0000 mg | ORAL_CAPSULE | Freq: Three times a day (TID) | ORAL | 1 refills | Status: AC | PRN

## 2022-01-31 MED ORDER — DICYCLOMINE HCL 10 MG PO CAPS: 10.0000 mg | ORAL_CAPSULE | Freq: Three times a day (TID) | ORAL | 1 refills | Status: DC | PRN

## 2022-01-31 NOTE — Telephone Encounter (Signed)
I discussed with patient and she wanted to use [facility]. [facility] and [facility] and canceled rx.

## 2022-01-31 NOTE — Telephone Encounter (Signed)
Patient left voicemail that [name] drug never received bentyl  rx she was to start. Seen on 8/10.   [phone]

## 2022-02-08 ENCOUNTER — Encounter (INDEPENDENT_AMBULATORY_CARE_PROVIDER_SITE_OTHER): Payer: Self-pay

## 2022-02-10 NOTE — Patient Instructions (Signed)
[name]  02/10/2022     '@PREFPERIOPPHARMACY'$ @   Your procedure is scheduled on  02/16/2022.   Report to [facility] at  [place]  [facility].   Call this number if you have problems the morning of surgery:  [phone]   Remember:  Follow the diet and prep instructions given to you by the office.     DO NOT take any medications for diabetes the morning of your procedure.     Take these medicines the morning of surgery with A SIP OF WATER                                              prozac, protonix, topamax.     Do not wear jewelry, make-up or nail polish.  Do not wear lotions, powders, or perfumes, or deodorant.  Do not shave 48 hours prior to surgery.  Men may shave face and neck.  Do not bring valuables to the hospital.  [facility] is not responsible for any belongings or valuables.  Contacts, dentures or bridgework may not be worn into surgery.  Leave your suitcase in the car.  After surgery it may be brought to your room.  For patients admitted to the hospital, discharge time will be determined by your treatment team.  Patients discharged the day of surgery will not be allowed to drive home and must have someone with them for 24 hours.    Special instructions:     DO NOT smoke tobacco or vape for 24 hours before your procedure.   Please read over the following fact sheets that you were given. Anesthesia Post-op Instructions and Care and Recovery After Surgery      Upper Endoscopy, Adult, Care After After the procedure, it is common to have a sore throat. It is also common to have: Mild stomach pain or discomfort. Bloating. Nausea. Follow these instructions at home: The instructions below may help you care for yourself at home. Your health care provider may give you more instructions. If you have questions, ask your health care provider. If you were given a sedative during the procedure, it can affect you for several hours. Do not drive or operate  machinery until your health care provider says that it is safe. If you will be going home right after the procedure, plan to have a responsible adult: Take you home from the hospital or clinic. You will not be allowed to drive. Care for you for the time you are told. Follow instructions from your health care provider about what you may eat and drink. Return to your normal activities as told by your health care provider. Ask your health care provider what activities are safe for you. Take over-the-counter and prescription medicines only as told by your health care provider. Contact a health care provider if you: Have a sore throat that lasts longer than one day. Have trouble swallowing. Have a fever. Get help right away if you: Vomit blood or your vomit looks like coffee grounds. Have bloody, black, or tarry stools. Have a very bad sore throat or you cannot swallow. Have difficulty breathing or very bad pain in your chest or abdomen. These symptoms may be an emergency. Get help right away. Call 911. Do not wait to see if the symptoms will go away. Do not drive yourself  to the hospital. Summary After the procedure, it is common to have a sore throat, mild stomach discomfort, bloating, and nausea. If you were given a sedative during the procedure, it can affect you for several hours. Do not drive until your health care provider says that it is safe. Follow instructions from your health care provider about what you may eat and drink. Return to your normal activities as told by your health care provider. This information is not intended to replace advice given to you by your health care provider. Make sure you discuss any questions you have with your health care provider. Document Revised: 09/07/2021 Document Reviewed: 09/07/2021 Elsevier Patient Education  [place]. Colonoscopy, Adult, Care After The following information offers guidance on how to care for yourself after your  procedure. Your health care provider may also give you more specific instructions. If you have problems or questions, contact your health care provider. What can I expect after the procedure? After the procedure, it is common to have: A small amount of blood in your stool for 24 hours after the procedure. Some gas. Mild cramping or bloating of your abdomen. Follow these instructions at home: Eating and drinking  Drink enough fluid to keep your urine pale yellow. Follow instructions from your health care provider about eating or drinking restrictions. Resume your normal diet as told by your health care provider. Avoid heavy or fried foods that are hard to digest. Activity Rest as told by your health care provider. Avoid sitting for a long time without moving. Get up to take short walks every 1-2 hours. This is important to improve blood flow and breathing. Ask for help if you feel weak or unsteady. Return to your normal activities as told by your health care provider. Ask your health care provider what activities are safe for you. Managing cramping and bloating  Try walking around when you have cramps or feel bloated. If directed, apply heat to your abdomen as told by your health care provider. Use the heat source that your health care provider recommends, such as a moist heat pack or a heating pad. Place a towel between your skin and the heat source. Leave the heat on for 20-30 minutes. Remove the heat if your skin turns bright red. This is especially important if you are unable to feel pain, heat, or cold. You have a greater risk of getting burned. General instructions If you were given a sedative during the procedure, it can affect you for several hours. Do not drive or operate machinery until your health care provider says that it is safe. For the first 24 hours after the procedure: Do not sign important documents. Do not drink alcohol. Do your regular daily activities at a slower pace  than normal. Eat soft foods that are easy to digest. Take over-the-counter and prescription medicines only as told by your health care provider. Keep all follow-up visits. This is important. Contact a health care provider if: You have blood in your stool 2-3 days after the procedure. Get help right away if: You have more than a small spotting of blood in your stool. You have large blood clots in your stool. You have swelling of your abdomen. You have nausea or vomiting. You have a fever. You have increasing pain in your abdomen that is not relieved with medicine. These symptoms may be an emergency. Get help right away. Call 911. Do not wait to see if the symptoms will go away. Do not drive yourself  to the hospital. Summary After the procedure, it is common to have a small amount of blood in your stool. You may also have mild cramping and bloating of your abdomen. If you were given a sedative during the procedure, it can affect you for several hours. Do not drive or operate machinery until your health care provider says that it is safe. Get help right away if you have a lot of blood in your stool, nausea or vomiting, a fever, or increased pain in your abdomen. This information is not intended to replace advice given to you by your health care provider. Make sure you discuss any questions you have with your health care provider. Document Revised: 01/19/2021 Document Reviewed: 01/19/2021 Elsevier Patient Education  [place] After This sheet gives you information about how to care for yourself after your procedure. Your health care provider may also give you more specific instructions. If you have problems or questions, contact your health care provider. What can I expect after the procedure? After the procedure, it is common to have: Tiredness. Forgetfulness about what happened after the procedure. Impaired judgment for important decisions. Nausea  or vomiting. Some difficulty with balance. Follow these instructions at home: For the time period you were told by your health care provider:     Rest as needed. Do not participate in activities where you could fall or become injured. Do not drive or use machinery. Do not drink alcohol. Do not take sleeping pills or medicines that cause drowsiness. Do not make important decisions or sign legal documents. Do not take care of children on your own. Eating and drinking Follow the diet that is recommended by your health care provider. Drink enough fluid to keep your urine pale yellow. If you vomit: Drink water, juice, or soup when you can drink without vomiting. Make sure you have little or no nausea before eating solid foods. General instructions Have a responsible adult stay with you for the time you are told. It is important to have someone help care for you until you are awake and alert. Take over-the-counter and prescription medicines only as told by your health care provider. If you have sleep apnea, surgery and certain medicines can increase your risk for breathing problems. Follow instructions from your health care provider about wearing your sleep device: Anytime you are sleeping, including during daytime naps. While taking prescription pain medicines, sleeping medicines, or medicines that make you drowsy. Avoid smoking. Keep all follow-up visits as told by your health care provider. This is important. Contact a health care provider if: You keep feeling nauseous or you keep vomiting. You feel light-headed. You are still sleepy or having trouble with balance after 24 hours. You develop a rash. You have a fever. You have redness or swelling around the IV site. Get help right away if: You have trouble breathing. You have new-onset confusion at home. Summary For several hours after your procedure, you may feel tired. You may also be forgetful and have poor judgment. Have a  responsible adult stay with you for the time you are told. It is important to have someone help care for you until you are awake and alert. Rest as told. Do not drive or operate machinery. Do not drink alcohol or take sleeping pills. Get help right away if you have trouble breathing, or if you suddenly become confused. This information is not intended to replace advice given to you by your health care provider. Make sure  you discuss any questions you have with your health care provider. Document Revised: 05/03/2021 Document Reviewed: 05/01/2019 Elsevier Patient Education  [place].

## 2022-02-14 ENCOUNTER — Encounter (HOSPITAL_COMMUNITY): Payer: Self-pay

## 2022-02-14 ENCOUNTER — Encounter (HOSPITAL_COMMUNITY)
Admission: RE | Admit: 2022-02-14 | Discharge: 2022-02-14 | Disposition: A | Discharge status: Home / Self Care | Payer: Medicare Other | Source: Ambulatory Visit | Attending: Gastroenterology | Admitting: Gastroenterology

## 2022-02-14 VITALS — BP 114/70 | HR 82 | Temp 98.5°F | Resp 18 | Ht 66.0 in | Wt 204.8 lb

## 2022-02-14 DIAGNOSIS — R1310 Dysphagia, unspecified: Secondary | ICD-10-CM | POA: Insufficient documentation

## 2022-02-14 DIAGNOSIS — R197 Diarrhea, unspecified: Secondary | ICD-10-CM | POA: Insufficient documentation

## 2022-02-14 DIAGNOSIS — I10 Essential (primary) hypertension: Secondary | ICD-10-CM | POA: Insufficient documentation

## 2022-02-14 DIAGNOSIS — Z01818 Encounter for other preprocedural examination: Secondary | ICD-10-CM | POA: Diagnosis not present

## 2022-02-14 HISTORY — DX: Rheumatoid arthritis, unspecified: M06.9

## 2022-02-14 HISTORY — DX: Essential (primary) hypertension: I10

## 2022-02-14 HISTORY — DX: Nausea with vomiting, unspecified: R11.2

## 2022-02-14 HISTORY — DX: Other specified postprocedural states: Z98.890

## 2022-02-14 HISTORY — DX: Anxiety disorder, unspecified: F41.9

## 2022-02-14 LAB — BASIC METABOLIC PANEL
Anion gap: 6 (ref 5–15)
BUN: 28 mg/dL — ABNORMAL HIGH (ref 8–23)
CO2: 25 mmol/L (ref 22–32)
Calcium: 9.7 mg/dL (ref 8.9–10.3)
Chloride: 109 mmol/L (ref 98–111)
Creatinine, Ser: 1.09 mg/dL — ABNORMAL HIGH (ref 0.44–1.00)
GFR, Estimated: 54 mL/min — ABNORMAL LOW (ref >60)
Glucose, Bld: 137 mg/dL — ABNORMAL HIGH (ref 70–99)
Potassium: 4 mmol/L (ref 3.5–5.1)
Sodium: 140 mmol/L (ref 135–145)

## 2022-02-16 ENCOUNTER — Ambulatory Visit (HOSPITAL_COMMUNITY)
Admission: RE | Admit: 2022-02-16 | Discharge: 2022-02-16 | Disposition: A | Discharge status: Home / Self Care | Payer: Medicare Other | Source: Ambulatory Visit | Attending: Gastroenterology | Admitting: Gastroenterology

## 2022-02-16 ENCOUNTER — Ambulatory Visit (HOSPITAL_COMMUNITY): Payer: Medicare Other | Admitting: Anesthesiology

## 2022-02-16 ENCOUNTER — Encounter (HOSPITAL_COMMUNITY): Payer: Self-pay | Admitting: Gastroenterology

## 2022-02-16 ENCOUNTER — Ambulatory Visit (HOSPITAL_BASED_OUTPATIENT_CLINIC_OR_DEPARTMENT_OTHER): Payer: Medicare Other | Admitting: Anesthesiology

## 2022-02-16 ENCOUNTER — Encounter (HOSPITAL_COMMUNITY)
Admission: RE | Disposition: A | Discharge status: Home / Self Care | Payer: Self-pay | Source: Ambulatory Visit | Attending: Gastroenterology

## 2022-02-16 DIAGNOSIS — Z87891 Personal history of nicotine dependence: Secondary | ICD-10-CM | POA: Diagnosis not present

## 2022-02-16 DIAGNOSIS — K449 Diaphragmatic hernia without obstruction or gangrene: Secondary | ICD-10-CM | POA: Diagnosis not present

## 2022-02-16 DIAGNOSIS — K5730 Diverticulosis of large intestine without perforation or abscess without bleeding: Secondary | ICD-10-CM | POA: Insufficient documentation

## 2022-02-16 DIAGNOSIS — R159 Full incontinence of feces: Secondary | ICD-10-CM | POA: Diagnosis not present

## 2022-02-16 DIAGNOSIS — G43909 Migraine, unspecified, not intractable, without status migrainosus: Secondary | ICD-10-CM | POA: Diagnosis not present

## 2022-02-16 DIAGNOSIS — E119 Type 2 diabetes mellitus without complications: Secondary | ICD-10-CM | POA: Insufficient documentation

## 2022-02-16 DIAGNOSIS — R1310 Dysphagia, unspecified: Secondary | ICD-10-CM | POA: Insufficient documentation

## 2022-02-16 DIAGNOSIS — K648 Other hemorrhoids: Secondary | ICD-10-CM | POA: Diagnosis not present

## 2022-02-16 DIAGNOSIS — K219 Gastro-esophageal reflux disease without esophagitis: Secondary | ICD-10-CM | POA: Insufficient documentation

## 2022-02-16 DIAGNOSIS — F419 Anxiety disorder, unspecified: Secondary | ICD-10-CM | POA: Diagnosis not present

## 2022-02-16 DIAGNOSIS — M069 Rheumatoid arthritis, unspecified: Secondary | ICD-10-CM | POA: Insufficient documentation

## 2022-02-16 DIAGNOSIS — R152 Fecal urgency: Secondary | ICD-10-CM | POA: Insufficient documentation

## 2022-02-16 DIAGNOSIS — M329 Systemic lupus erythematosus, unspecified: Secondary | ICD-10-CM | POA: Diagnosis not present

## 2022-02-16 DIAGNOSIS — M1990 Unspecified osteoarthritis, unspecified site: Secondary | ICD-10-CM | POA: Insufficient documentation

## 2022-02-16 DIAGNOSIS — I10 Essential (primary) hypertension: Secondary | ICD-10-CM | POA: Insufficient documentation

## 2022-02-16 DIAGNOSIS — Z7984 Long term (current) use of oral hypoglycemic drugs: Secondary | ICD-10-CM | POA: Diagnosis not present

## 2022-02-16 DIAGNOSIS — R197 Diarrhea, unspecified: Secondary | ICD-10-CM | POA: Diagnosis not present

## 2022-02-16 DIAGNOSIS — G4730 Sleep apnea, unspecified: Secondary | ICD-10-CM | POA: Diagnosis not present

## 2022-02-16 DIAGNOSIS — Z7969 Long term (current) use of other immunomodulators and immunosuppressants: Secondary | ICD-10-CM | POA: Diagnosis not present

## 2022-02-16 DIAGNOSIS — Z79899 Other long term (current) drug therapy: Secondary | ICD-10-CM | POA: Insufficient documentation

## 2022-02-16 HISTORY — PX: BIOPSY: SHX5522

## 2022-02-16 HISTORY — PX: SAVORY DILATION: SHX5439

## 2022-02-16 HISTORY — PX: COLONOSCOPY WITH PROPOFOL: SHX5780

## 2022-02-16 HISTORY — PX: ESOPHAGOGASTRODUODENOSCOPY (EGD) WITH PROPOFOL: SHX5813

## 2022-02-16 LAB — GLUCOSE, CAPILLARY: Glucose-Capillary: 121 mg/dL — ABNORMAL HIGH (ref 70–99)

## 2022-02-16 LAB — HM COLONOSCOPY

## 2022-02-16 SURGERY — COLONOSCOPY WITH PROPOFOL
Anesthesia: General

## 2022-02-16 MED ORDER — PROPOFOL 500 MG/50ML IV EMUL
INTRAVENOUS | Status: DC | PRN
  Administered 2022-02-16: 200 ug/kg/min via INTRAVENOUS

## 2022-02-16 MED ORDER — LIDOCAINE HCL (CARDIAC) PF 100 MG/5ML IV SOSY
PREFILLED_SYRINGE | INTRAVENOUS | Status: DC | PRN
  Administered 2022-02-16: 100 mg via INTRAVENOUS

## 2022-02-16 MED ORDER — PROPOFOL 10 MG/ML IV BOLUS
INTRAVENOUS | Status: DC | PRN
  Administered 2022-02-16: 50 mg via INTRAVENOUS

## 2022-02-16 MED ORDER — LACTATED RINGERS IV SOLN
INTRAVENOUS | Status: DC
  Administered 2022-02-16: 1000 mL via INTRAVENOUS

## 2022-02-16 NOTE — Transfer of Care (Signed)
Immediate Anesthesia Transfer of Care Note  Patient: [name]  Procedure(s) Performed: COLONOSCOPY WITH PROPOFOL RANDOM BIOPSYS ESOPHAGOGASTRODUODENOSCOPY (EGD) WITH PROPOFOL SAVORY DILATION  Patient Location: Short Stay  Anesthesia Type:General  Level of Consciousness: awake, alert , oriented and patient cooperative  Airway & Oxygen Therapy: Patient Spontanous Breathing  Post-op Assessment: Report given to RN, Post -op Vital signs reviewed and stable and Patient moving all extremities  Post vital signs: Reviewed and stable  Last Vitals:  Vitals Value Taken Time  BP    Temp    Pulse    Resp    SpO2      Last Pain:  Vitals:   02/16/22 1043  TempSrc:   PainSc: 0-No pain         Complications: No notable events documented.

## 2022-02-16 NOTE — Op Note (Signed)
[facility] Patient Name: [name] Procedure Date: 02/16/2022 10:34 AM MRN: [id number] Date of Birth: [birth year] Attending MD: [name] ,  CSN: [id number] Age: 71 Admit Type: Outpatient Procedure:                Upper GI endoscopy Indications:              Dysphagia Providers:                [name], Ferris Page, Everardo Pacific,                            [place] Risa Grill, Technician Referring MD:              Medicines:                Monitored Anesthesia Care Complications:            No immediate complications. Estimated Blood Loss:     Estimated blood loss: none. Procedure:                Pre-Anesthesia Assessment:                           - Prior to the procedure, a History and Physical                            was performed, and patient medications, allergies                            and sensitivities were reviewed. The patient's                            tolerance of previous anesthesia was reviewed.                           - The risks and benefits of the procedure and the                            sedation options and risks were discussed with the                            patient. All questions were answered and informed                            consent was obtained.                           - ASA Grade Assessment: III - A patient with severe                            systemic disease.                           After obtaining informed consent, the endoscope was                            passed under direct vision. Throughout the  procedure, the patient's blood pressure, pulse, and                            oxygen saturations were monitored continuously. The                            [id number]-H190 ([id number]) scope was introduced through the                            mouth, and advanced to the second part of duodenum.                            The upper GI endoscopy was accomplished without                             difficulty. The patient tolerated the procedure                            well. Scope In: 10:46:59 AM Scope Out: 10:56:32 AM Total Procedure Duration: 0 hours 9 minutes 33 seconds  Findings:      No endoscopic abnormality was evident in the esophagus to explain the       patient's complaint of dysphagia. It was decided, however, to proceed       with dilation of the entire esophagus. A guidewire was placed and the       scope was withdrawn. Dilation was performed with a Savary dilator with       moderate resistance at 18 mm. Mucosal disruption was seen upon       reinspection of the upper third and lower third of the esophagus.      A 2 cm hiatal hernia was present.      The stomach was normal.      The examined duodenum was normal. Impression:               - No endoscopic esophageal abnormality to explain                            patient's dysphagia. Esophagus dilated. Dilated.                           - 2 cm hiatal hernia.                           - Normal stomach.                           - Normal examined duodenum.                           - No specimens collected. Moderate Sedation:      Per Anesthesia Care Recommendation:           - Discharge patient to home (ambulatory).                           - Resume previous diet.                           -  Continue present medications. Procedure Code(s):        --- Professional ---                           [phone], Esophagogastroduodenoscopy, flexible,                            transoral; with insertion of guide wire followed by                            passage of dilator(s) through esophagus over guide                            wire Diagnosis Code(s):        --- Professional ---                           R13.10, Dysphagia, unspecified                           K44.9, Diaphragmatic hernia without obstruction or                            gangrene CPT copyright 2019 American Medical Association. All rights  reserved. The codes documented in this report are preliminary and upon coder review may  be revised to meet current compliance requirements. [name], MD [name],  02/16/2022 11:02:03 AM This report has been signed electronically. Number of Addenda: 0

## 2022-02-16 NOTE — Anesthesia Preprocedure Evaluation (Signed)
Anesthesia Evaluation  Patient identified by MRN, date of birth, ID band Patient awake    Reviewed: Allergy & Precautions, NPO status , Patient's Chart, lab work & pertinent test results  History of Anesthesia Complications (+) PONV, PROLONGED EMERGENCE and history of anesthetic complications  Airway Mallampati: II  TM Distance: >3 FB Neck ROM: Full    Dental  (+) Caps, Dental Advisory Given, Chipped,    Pulmonary sleep apnea , former smoker,    Pulmonary exam normal breath sounds clear to auscultation       Cardiovascular hypertension, Pt. on medications Normal cardiovascular exam Rhythm:Regular Rate:Normal     Neuro/Psych  Headaches, PSYCHIATRIC DISORDERS Anxiety  Neuromuscular disease    GI/Hepatic Neg liver ROS, GERD  Medicated and Controlled,  Endo/Other  diabetes, Well Controlled, Type 2, Oral Hypoglycemic Agents  Renal/GU negative Renal ROS  negative genitourinary   Musculoskeletal  (+) Arthritis , Osteoarthritis,    Abdominal   Peds negative pediatric ROS (+)  Hematology negative hematology ROS (+)   Anesthesia Other Findings Lupus   Reproductive/Obstetrics negative OB ROS                             Anesthesia Physical Anesthesia Plan  ASA: 2  Anesthesia Plan: General   Post-op Pain Management: Minimal or no pain anticipated   Induction: Intravenous  PONV Risk Score and Plan: Propofol infusion  Airway Management Planned: Nasal Cannula and Natural Airway  Additional Equipment:   Intra-op Plan:   Post-operative Plan:   Informed Consent: I have reviewed the patients History and Physical, chart, labs and discussed the procedure including the risks, benefits and alternatives for the proposed anesthesia with the patient or authorized representative who has indicated his/her understanding and acceptance.     Dental advisory given  Plan Discussed with: CRNA and  Surgeon  Anesthesia Plan Comments:         Anesthesia Quick Evaluation

## 2022-02-16 NOTE — Interval H&P Note (Signed)
History and Physical Interval Note:  02/16/2022 10:26 AM  [name]  has presented today for surgery, with the diagnosis of Diarrhea Dysphagia.  The various methods of treatment have been discussed with the patient and family. After consideration of risks, benefits and other options for treatment, the patient has consented to  Procedure(s) with comments: COLONOSCOPY WITH PROPOFOL (N/A) - 1015 ASA 3 RANDOM BIOPSYS (N/A) as a surgical intervention.  The patient's history has been reviewed, patient examined, no change in status, stable for surgery.  I have reviewed the patient's chart and labs.  Questions were answered to the patient's satisfaction.     [name]

## 2022-02-16 NOTE — Anesthesia Postprocedure Evaluation (Signed)
Anesthesia Post Note  Patient: [name]  Procedure(s) Performed: COLONOSCOPY WITH PROPOFOL RANDOM BIOPSYS ESOPHAGOGASTRODUODENOSCOPY (EGD) WITH PROPOFOL SAVORY DILATION  Patient location during evaluation: Phase II Anesthesia Type: General Level of consciousness: awake and alert and oriented Pain management: pain level controlled Vital Signs Assessment: post-procedure vital signs reviewed and stable Respiratory status: spontaneous breathing, nonlabored ventilation and respiratory function stable Cardiovascular status: blood pressure returned to baseline and stable Postop Assessment: no apparent nausea or vomiting Anesthetic complications: no   No notable events documented.   Last Vitals:  Vitals:   02/16/22 0916 02/16/22 1135  BP: 139/67 125/77  Pulse:  66  Resp: 11 15  Temp: 36.7 C 36.7 C  SpO2: 98% 99%    Last Pain:  Vitals:   02/16/22 1135  TempSrc: Oral  PainSc: 0-No pain                 Adelfo Diebel C Curtistine Pettitt

## 2022-02-16 NOTE — Op Note (Signed)
[facility] Patient Name: [name] Procedure Date: 02/16/2022 10:40 AM MRN: [id number] Date of Birth: [birth year] Attending MD: [name] ,  CSN: [id number] Age: 71 Admit Type: Outpatient Procedure:                Colonoscopy Indications:              Clinically significant diarrhea of unexplained                            origin Providers:                [name], [place]ingham Page, Everardo Pacific,                            [place] Risa Grill, Technician Referring MD:              Medicines:                Monitored Anesthesia Care Complications:            No immediate complications. Estimated Blood Loss:     Estimated blood loss: none. Procedure:                Pre-Anesthesia Assessment:                           - Prior to the procedure, a History and Physical                            was performed, and patient medications, allergies                            and sensitivities were reviewed. The patient's                            tolerance of previous anesthesia was reviewed.                           - The risks and benefits of the procedure and the                            sedation options and risks were discussed with the                            patient. All questions were answered and informed                            consent was obtained.                           - ASA Grade Assessment: III - A patient with severe                            systemic disease.                           After obtaining informed consent, the colonoscope  was passed under direct vision. Throughout the                            procedure, the patient's blood pressure, pulse, and                            oxygen saturations were monitored continuously. The                            PCF-HQ190L ([id number]) scope was introduced through                            the anus and advanced to the the terminal ileum.                             The colonoscopy was performed without difficulty.                            The patient tolerated the procedure well. The                            quality of the bowel preparation was adequate. Scope In: 11:02:50 AM Scope Out: 11:25:18 AM Scope Withdrawal Time: 0 hours 17 minutes 27 seconds  Total Procedure Duration: 0 hours 22 minutes 28 seconds  Findings:      The perianal and digital rectal examinations were normal.      The terminal ileum appeared normal.      Multiple small and large-mouthed diverticula were found in the sigmoid       colon. Biopsies from the normal colon were taken with a cold forceps for       histology. Biopsies for histology were taken with a cold forceps from       the right colon and left colon for evaluation of microscopic colitis.      Non-bleeding internal hemorrhoids were found during retroflexion. The       hemorrhoids were medium-sized. Impression:               - The examined portion of the ileum was normal.                           - Diverticulosis in the sigmoid colon. Biopsied.                           - Non-bleeding internal hemorrhoids. Moderate Sedation:      Per Anesthesia Care Recommendation:           - Discharge patient to home (ambulatory).                           - Resume previous diet.                           - Await pathology results.                           - Repeat colonoscopy in 10 years for screening  purposes. Procedure Code(s):        --- Professional ---                           [phone], Colonoscopy, flexible; with biopsy, single                            or multiple Diagnosis Code(s):        --- Professional ---                           K64.8, Other hemorrhoids                           R19.7, Diarrhea, unspecified                           K57.30, Diverticulosis of large intestine without                            perforation or abscess without bleeding CPT copyright 2019 American  Medical Association. All rights reserved. The codes documented in this report are preliminary and upon coder review may  be revised to meet current compliance requirements. [name], MD [name],  02/16/2022 11:30:39 AM This report has been signed electronically. Number of Addenda: 0

## 2022-02-16 NOTE — Discharge Instructions (Addendum)
You are being discharged to home.  Resume your previous diet.  Continue your present medications.  Your physician has recommended a repeat colonoscopy in 10 years for screening purposes.

## 2022-02-20 ENCOUNTER — Encounter (INDEPENDENT_AMBULATORY_CARE_PROVIDER_SITE_OTHER): Payer: Self-pay | Admitting: *Deleted

## 2022-02-20 LAB — SURGICAL PATHOLOGY

## 2022-02-21 ENCOUNTER — Other Ambulatory Visit (INDEPENDENT_AMBULATORY_CARE_PROVIDER_SITE_OTHER): Payer: Self-pay

## 2022-02-21 DIAGNOSIS — K529 Noninfective gastroenteritis and colitis, unspecified: Secondary | ICD-10-CM

## 2022-02-21 DIAGNOSIS — R1319 Other dysphagia: Secondary | ICD-10-CM

## 2022-02-21 DIAGNOSIS — R197 Diarrhea, unspecified: Secondary | ICD-10-CM

## 2022-02-21 DIAGNOSIS — K769 Liver disease, unspecified: Secondary | ICD-10-CM

## 2022-02-23 ENCOUNTER — Encounter (HOSPITAL_COMMUNITY): Payer: Self-pay | Admitting: Gastroenterology

## 2022-02-24 DIAGNOSIS — M0579 Rheumatoid arthritis with rheumatoid factor of multiple sites without organ or systems involvement: Secondary | ICD-10-CM | POA: Diagnosis not present

## 2022-02-27 DIAGNOSIS — R197 Diarrhea, unspecified: Secondary | ICD-10-CM | POA: Diagnosis not present

## 2022-02-27 DIAGNOSIS — K529 Noninfective gastroenteritis and colitis, unspecified: Secondary | ICD-10-CM | POA: Diagnosis not present

## 2022-02-27 DIAGNOSIS — R1319 Other dysphagia: Secondary | ICD-10-CM | POA: Diagnosis not present

## 2022-02-27 DIAGNOSIS — K769 Liver disease, unspecified: Secondary | ICD-10-CM | POA: Diagnosis not present

## 2022-03-02 ENCOUNTER — Other Ambulatory Visit: Payer: Self-pay | Admitting: Gastroenterology

## 2022-03-02 DIAGNOSIS — K529 Noninfective gastroenteritis and colitis, unspecified: Secondary | ICD-10-CM | POA: Diagnosis not present

## 2022-03-02 DIAGNOSIS — R1319 Other dysphagia: Secondary | ICD-10-CM | POA: Diagnosis not present

## 2022-03-02 DIAGNOSIS — R197 Diarrhea, unspecified: Secondary | ICD-10-CM | POA: Diagnosis not present

## 2022-03-02 DIAGNOSIS — K769 Liver disease, unspecified: Secondary | ICD-10-CM | POA: Diagnosis not present

## 2022-03-02 LAB — CELIAC DISEASE PANEL
Endomysial IgA: NEGATIVE
IgA/Immunoglobulin A, Serum: 304 mg/dL (ref 64–422)
Transglutaminase IgA: <2 U/mL (ref 0–3)

## 2022-03-10 LAB — FECAL FAT, QUALITATIVE
Fat Qual Neutral, Stl: NORMAL
Fat Qual Total, Stl: NORMAL

## 2022-03-10 LAB — PANCREATIC ELASTASE, FECAL: Pancreatic Elastase, Fecal: 335 ug Elast./g (ref >200)

## 2022-03-17 IMAGING — MR MR ABDOMEN WO/W CM
21 series · 48 of 48 positions shown · IV contrast (Gadavist)
Comparison: MRI on 05/09/2019

CLINICAL DATA: Abdominal pain.  Follow-up hepatic mass.

EXAM:
MRI ABDOMEN WITHOUT AND WITH CONTRAST
TECHNIQUE: Multiplanar multisequence MR imaging of the abdomen was performed
both before and after the administration of intravenous contrast.
CONTRAST:  7mL GADAVIST GADOBUTROL 1 MMOL/ML IV SOLN

[Series 4: cor haste · coronal · 6.0mm · 1.19mm/px · 1 of 30 slices shown]
[im 1/30]
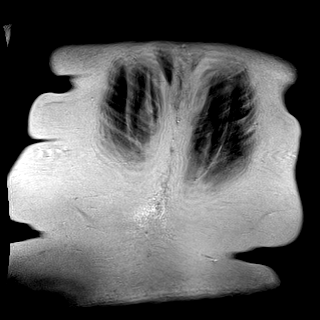

[Series 5: ax haste · axial · 6.0mm · 1.19mm/px · 1 of 30 slices shown]
[im 1/30]
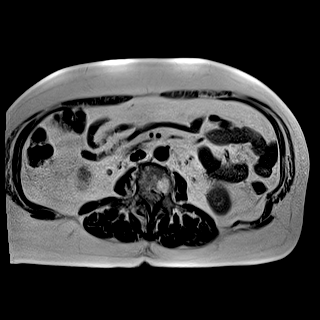

[Series 8: T2 fat-sat · axial · 6.0mm · 1.19mm/px · 1 of 30 slices shown]
[im 1/30]
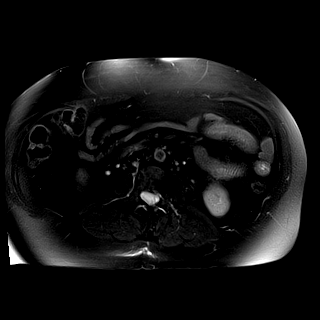

[Series 9: t1_vibe_opp-in_tra_p4_bh · axial · 3.0mm · 1.19mm/px · z∈[-207,+6]mm · 5 of 144 slices shown]
[im 1/144]
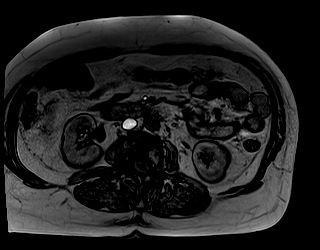
[im 36/144]
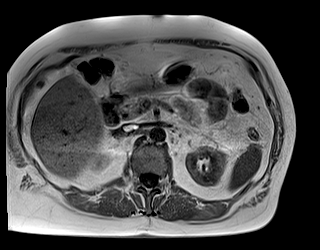
[im 72/144]
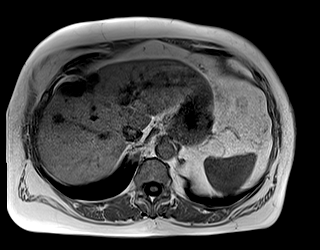
[im 108/144]
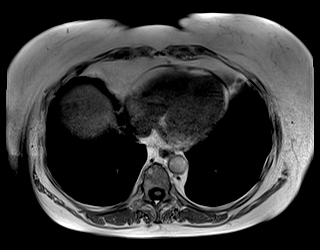
[im 144/144]
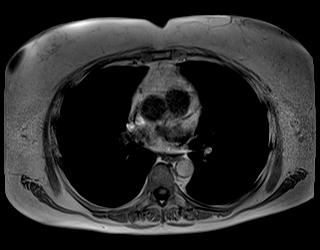

[Series 10: DWI · axial · 6.0mm · 1.42mm/px · 1 of 30 slices shown (1 of 4)]
[im 1/30]
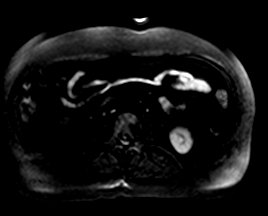

[Series 10: DWI · axial · 6.0mm · 1.42mm/px · 1 of 30 slices shown (2 of 4)]
[im 1/30]
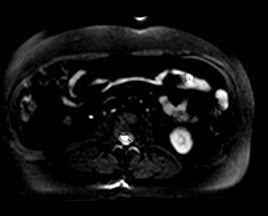

[Series 10: DWI · axial · 6.0mm · 1.42mm/px · 1 of 30 slices shown (3 of 4)]
[im 1/30]
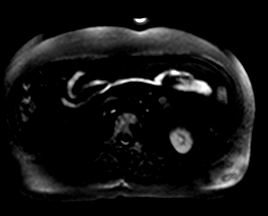

[Series 11: DWI · axial · 6.0mm · 1.42mm/px · 1 of 30 slices shown (4 of 4)]
[im 1/30]
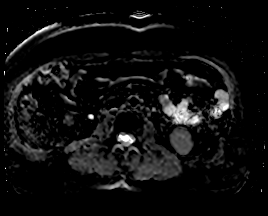

[Series 12: bSSFP · axial · 6.0mm · 0.74mm/px · 1 of 30 slices shown]
[im 1/30]
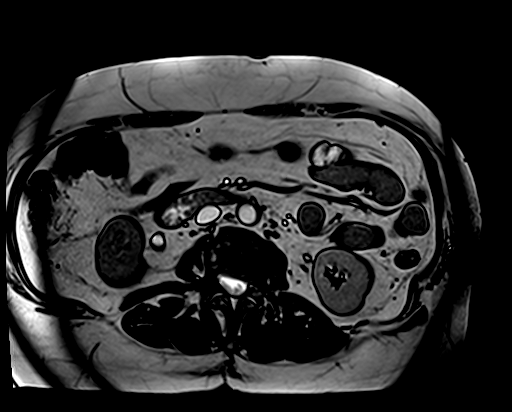

[Series 13: t1_vibe_fs_tra_p4_bh_pre · axial · 3.0mm · 1.19mm/px · z∈[-199,+14]mm · 2 of 72 slices shown]
[im 1/72]
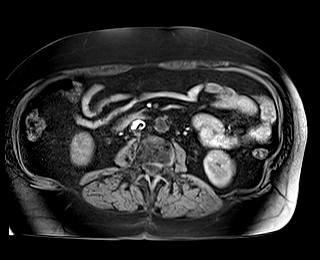
[im 72/72]
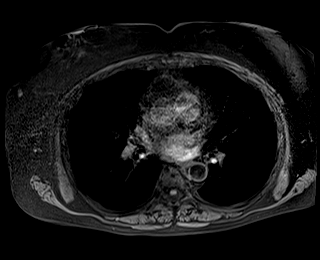

[Series 16: t1_vibe_fs_tra_p4_bh_post · axial · 3.0mm · 1.19mm/px · z∈[-199,+14]mm · 3 of 72 slices shown (1 of 4)]
[im 1/72]
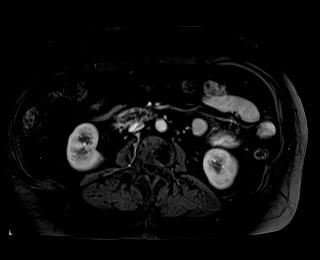
[im 36/72]
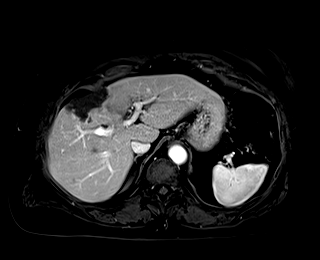
[im 72/72]
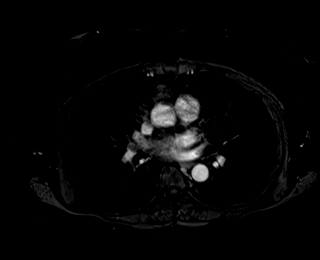

[Series 17: t1_vibe_fs_tra_p4_bh_post_sub · axial · 3.0mm · 1.19mm/px · z∈[-199,+14]mm · 3 of 72 slices shown (1 of 4)]
[im 1/72]
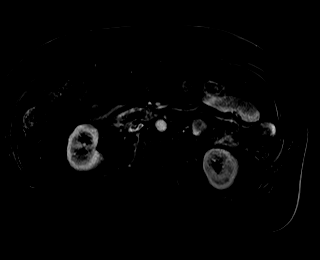
[im 36/72]
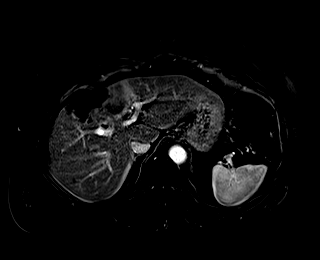
[im 72/72]
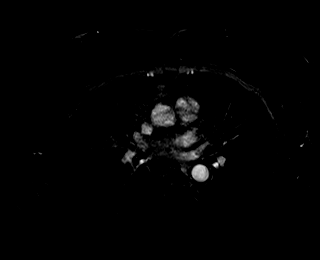

[Series 18: t1_vibe_fs_tra_p4_bh_post · axial · 3.0mm · 1.19mm/px · z∈[-199,+14]mm · 3 of 72 slices shown (2 of 4)]
[im 1/72]
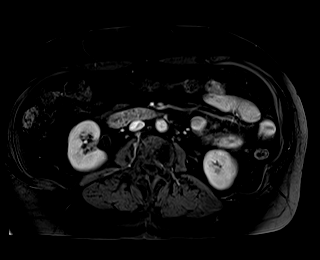
[im 36/72]
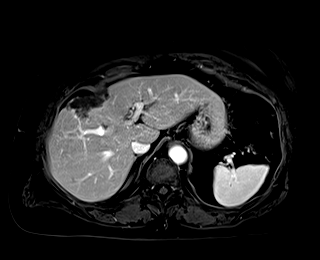
[im 72/72]
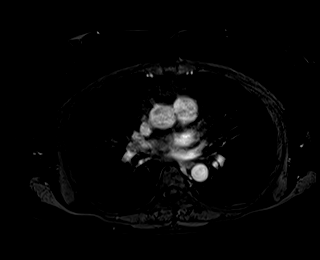

[Series 19: t1_vibe_fs_tra_p4_bh_post_sub · axial · 3.0mm · 1.19mm/px · z∈[-199,+14]mm · 3 of 72 slices shown (2 of 4)]
[im 1/72]
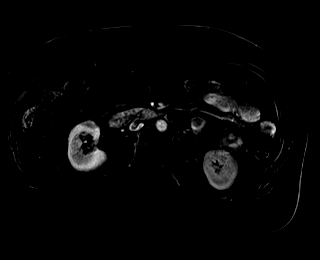
[im 36/72]
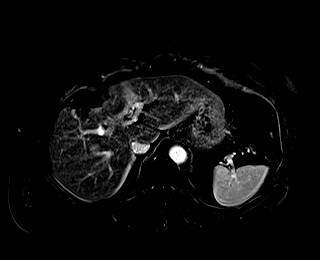
[im 72/72]
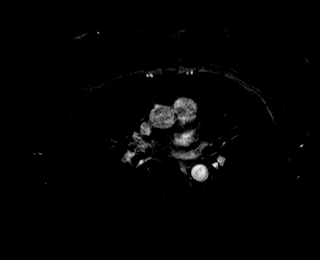

[Series 20: t1_vibe_fs_tra_p4_bh_post · axial · 3.0mm · 1.19mm/px · z∈[-199,+14]mm · 3 of 72 slices shown (3 of 4)]
[im 1/72]
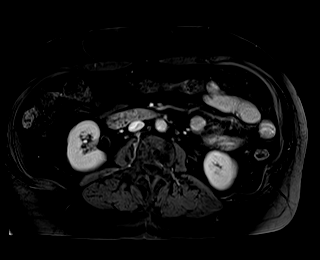
[im 36/72]
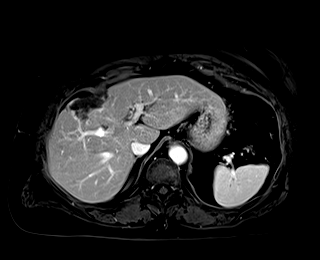
[im 72/72]
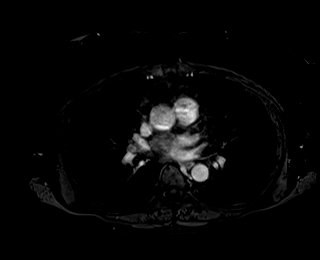

[Series 21: t1_vibe_fs_tra_p4_bh_post_sub · axial · 3.0mm · 1.19mm/px · z∈[-199,+14]mm · 3 of 72 slices shown (3 of 4)]
[im 1/72]
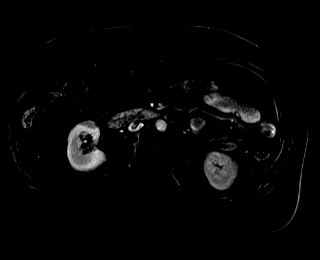
[im 36/72]
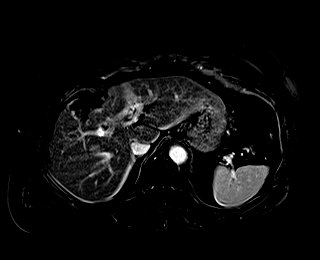
[im 72/72]
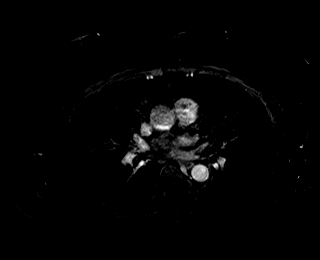

[Series 22: t1_vibe_fs_tra_p4_bh_post · axial · 3.0mm · 1.19mm/px · z∈[-199,+14]mm · 3 of 72 slices shown (4 of 4)]
[im 1/72]
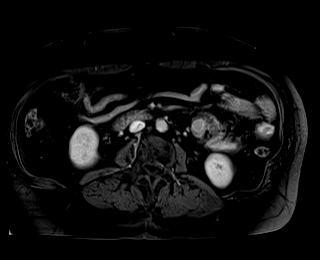
[im 36/72]
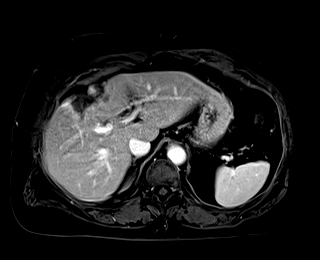
[im 72/72]
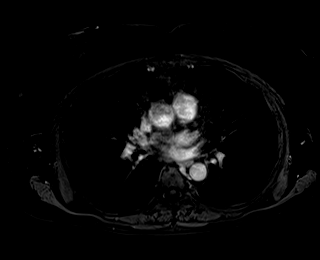

[Series 23: t1_vibe_fs_tra_p4_bh_post_sub · axial · 3.0mm · 1.19mm/px · z∈[-199,+14]mm · 3 of 72 slices shown (4 of 4)]
[im 1/72]
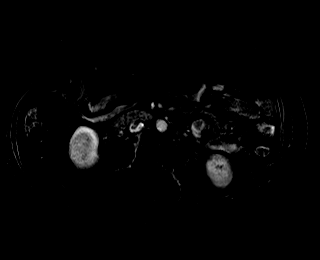
[im 36/72]
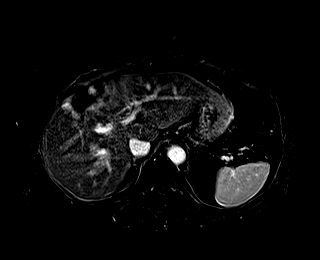
[im 72/72]
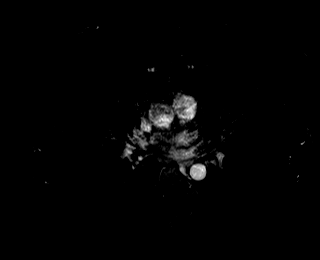

[Series 24: T1 dynamic post-contrast · coronal · 3.0mm · 1.31mm/px · 3 of 72 slices shown]
[im 1/72]
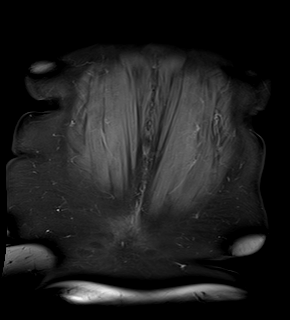
[im 36/72]
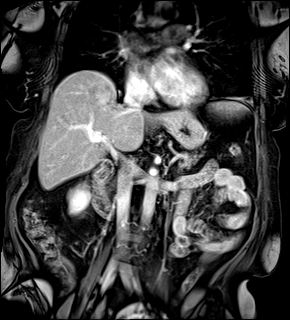
[im 72/72]
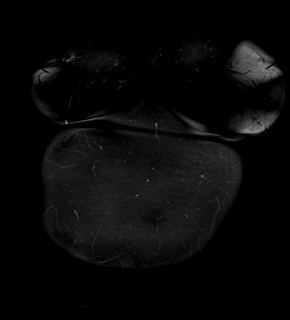

[Series 25: t1_vibe_fs_tra_p4_bh 5 min · axial · 3.0mm · 1.19mm/px · z∈[-199,+14]mm · 3 of 72 slices shown]
[im 1/72]
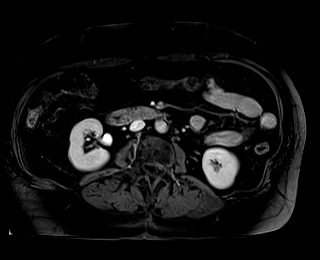
[im 36/72]
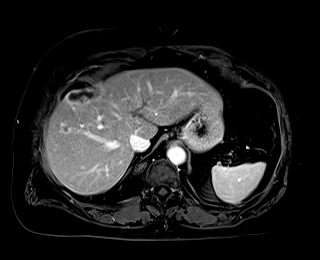
[im 72/72]
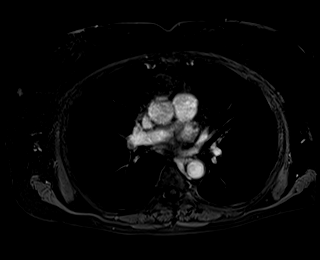

[Series 26: t1_vibe_fs_tra_p4_bh 5 min_sub · axial · 3.0mm · 1.19mm/px · z∈[-199,+14]mm · 3 of 72 slices shown]
[im 1/72]
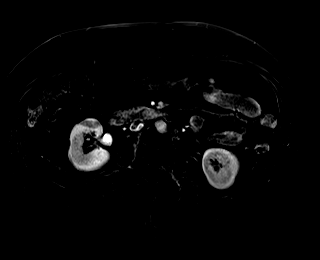
[im 36/72]
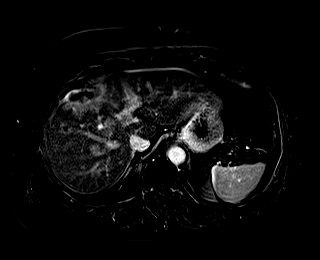
[im 72/72]
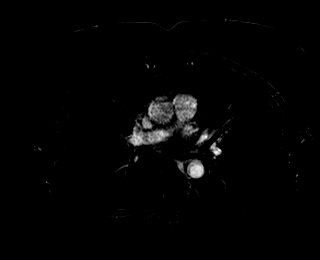

[48 of 48 positions shown; findings below may reference images not displayed]

FINDINGS: Lower chest: No acute findings.

Hepatobiliary: Multiple benign hepatic hemangiomas are again seen
and stable since previous study. Largest hemangioma in segment 4A/B
measures 7.1 x 3.8 cm. No new or enlarging liver lesions are
identified. Prior cholecystectomy. No evidence of biliary
obstruction.

Pancreas:  No mass or inflammatory changes.

Spleen:  Within normal limits in size and appearance.

Adrenals/Urinary Tract: 8 mm right adrenal soft tissue nodule is
stable and consistent with benign adenoma. No renal masses
identified. No evidence of hydronephrosis.

Stomach/Bowel: Visualized portion unremarkable.

Vascular/Lymphatic: No pathologically enlarged lymph nodes
identified. No abdominal aortic aneurysm.

Other:  None.

Musculoskeletal: No suspicious bone lesions identified. Multiple
benign vertebral hemangiomas are stable since previous study.
IMPRESSION: No acute findings.

Multiple benign hepatic hemangiomas remain stable.

Stable tiny benign right adrenal adenoma.

## 2022-03-27 DIAGNOSIS — M0579 Rheumatoid arthritis with rheumatoid factor of multiple sites without organ or systems involvement: Secondary | ICD-10-CM | POA: Diagnosis not present

## 2022-04-13 DIAGNOSIS — R5383 Other fatigue: Secondary | ICD-10-CM | POA: Diagnosis not present

## 2022-04-13 DIAGNOSIS — E7849 Other hyperlipidemia: Secondary | ICD-10-CM | POA: Diagnosis not present

## 2022-04-13 DIAGNOSIS — E1165 Type 2 diabetes mellitus with hyperglycemia: Secondary | ICD-10-CM | POA: Diagnosis not present

## 2022-04-13 DIAGNOSIS — E559 Vitamin D deficiency, unspecified: Secondary | ICD-10-CM | POA: Diagnosis not present

## 2022-04-13 DIAGNOSIS — N183 Chronic kidney disease, stage 3 unspecified: Secondary | ICD-10-CM | POA: Diagnosis not present

## 2022-04-13 DIAGNOSIS — I10 Essential (primary) hypertension: Secondary | ICD-10-CM | POA: Diagnosis not present

## 2022-04-13 DIAGNOSIS — K2100 Gastro-esophageal reflux disease with esophagitis, without bleeding: Secondary | ICD-10-CM | POA: Diagnosis not present

## 2022-04-17 ENCOUNTER — Encounter (INDEPENDENT_AMBULATORY_CARE_PROVIDER_SITE_OTHER): Payer: Medicare Other | Admitting: Ophthalmology

## 2022-04-17 DIAGNOSIS — H4423 Degenerative myopia, bilateral: Secondary | ICD-10-CM

## 2022-04-17 DIAGNOSIS — I10 Essential (primary) hypertension: Secondary | ICD-10-CM | POA: Diagnosis not present

## 2022-04-17 DIAGNOSIS — Z79899 Other long term (current) drug therapy: Secondary | ICD-10-CM

## 2022-04-17 DIAGNOSIS — M069 Rheumatoid arthritis, unspecified: Secondary | ICD-10-CM

## 2022-04-17 DIAGNOSIS — H35033 Hypertensive retinopathy, bilateral: Secondary | ICD-10-CM

## 2022-04-17 DIAGNOSIS — H43813 Vitreous degeneration, bilateral: Secondary | ICD-10-CM

## 2022-04-18 DIAGNOSIS — D72819 Decreased white blood cell count, unspecified: Secondary | ICD-10-CM | POA: Diagnosis not present

## 2022-04-18 DIAGNOSIS — E7849 Other hyperlipidemia: Secondary | ICD-10-CM | POA: Diagnosis not present

## 2022-04-18 DIAGNOSIS — G619 Inflammatory polyneuropathy, unspecified: Secondary | ICD-10-CM | POA: Diagnosis not present

## 2022-04-18 DIAGNOSIS — E1143 Type 2 diabetes mellitus with diabetic autonomic (poly)neuropathy: Secondary | ICD-10-CM | POA: Diagnosis not present

## 2022-04-18 DIAGNOSIS — R5383 Other fatigue: Secondary | ICD-10-CM | POA: Diagnosis not present

## 2022-04-18 DIAGNOSIS — D649 Anemia, unspecified: Secondary | ICD-10-CM | POA: Diagnosis not present

## 2022-04-18 DIAGNOSIS — D696 Thrombocytopenia, unspecified: Secondary | ICD-10-CM | POA: Diagnosis not present

## 2022-04-18 DIAGNOSIS — N1831 Chronic kidney disease, stage 3a: Secondary | ICD-10-CM | POA: Diagnosis not present

## 2022-04-18 DIAGNOSIS — G629 Polyneuropathy, unspecified: Secondary | ICD-10-CM | POA: Diagnosis not present

## 2022-04-18 DIAGNOSIS — Z23 Encounter for immunization: Secondary | ICD-10-CM | POA: Diagnosis not present

## 2022-04-18 DIAGNOSIS — I10 Essential (primary) hypertension: Secondary | ICD-10-CM | POA: Diagnosis not present

## 2022-04-18 DIAGNOSIS — E1165 Type 2 diabetes mellitus with hyperglycemia: Secondary | ICD-10-CM | POA: Diagnosis not present

## 2022-04-24 ENCOUNTER — Ambulatory Visit (INDEPENDENT_AMBULATORY_CARE_PROVIDER_SITE_OTHER): Payer: Medicare Other | Admitting: Gastroenterology

## 2022-04-24 DIAGNOSIS — M0579 Rheumatoid arthritis with rheumatoid factor of multiple sites without organ or systems involvement: Secondary | ICD-10-CM | POA: Diagnosis not present

## 2022-04-24 DIAGNOSIS — Z79899 Other long term (current) drug therapy: Secondary | ICD-10-CM | POA: Diagnosis not present

## 2022-05-08 DIAGNOSIS — Z79899 Other long term (current) drug therapy: Secondary | ICD-10-CM | POA: Diagnosis not present

## 2022-05-08 DIAGNOSIS — M0579 Rheumatoid arthritis with rheumatoid factor of multiple sites without organ or systems involvement: Secondary | ICD-10-CM | POA: Diagnosis not present

## 2022-05-08 DIAGNOSIS — M329 Systemic lupus erythematosus, unspecified: Secondary | ICD-10-CM | POA: Diagnosis not present

## 2022-05-08 DIAGNOSIS — M3501 Sicca syndrome with keratoconjunctivitis: Secondary | ICD-10-CM | POA: Diagnosis not present

## 2022-05-08 DIAGNOSIS — M1991 Primary osteoarthritis, unspecified site: Secondary | ICD-10-CM | POA: Diagnosis not present

## 2022-05-08 DIAGNOSIS — K30 Functional dyspepsia: Secondary | ICD-10-CM | POA: Diagnosis not present

## 2022-05-08 DIAGNOSIS — Z6831 Body mass index (BMI) 31.0-31.9, adult: Secondary | ICD-10-CM | POA: Diagnosis not present

## 2022-05-08 DIAGNOSIS — E669 Obesity, unspecified: Secondary | ICD-10-CM | POA: Diagnosis not present

## 2022-05-22 DIAGNOSIS — M0579 Rheumatoid arthritis with rheumatoid factor of multiple sites without organ or systems involvement: Secondary | ICD-10-CM | POA: Diagnosis not present

## 2022-06-02 DIAGNOSIS — Z20828 Contact with and (suspected) exposure to other viral communicable diseases: Secondary | ICD-10-CM | POA: Diagnosis not present

## 2022-06-02 DIAGNOSIS — R059 Cough, unspecified: Secondary | ICD-10-CM | POA: Diagnosis not present

## 2022-06-02 DIAGNOSIS — R030 Elevated blood-pressure reading, without diagnosis of hypertension: Secondary | ICD-10-CM | POA: Diagnosis not present

## 2022-06-02 DIAGNOSIS — Z6830 Body mass index (BMI) 30.0-30.9, adult: Secondary | ICD-10-CM | POA: Diagnosis not present

## 2022-06-19 DIAGNOSIS — M0579 Rheumatoid arthritis with rheumatoid factor of multiple sites without organ or systems involvement: Secondary | ICD-10-CM | POA: Diagnosis not present

## 2022-06-22 ENCOUNTER — Encounter (INDEPENDENT_AMBULATORY_CARE_PROVIDER_SITE_OTHER): Payer: Self-pay | Admitting: Gastroenterology

## 2022-06-22 ENCOUNTER — Ambulatory Visit (INDEPENDENT_AMBULATORY_CARE_PROVIDER_SITE_OTHER): Payer: Medicare Other | Admitting: Gastroenterology

## 2022-06-22 VITALS — BP 137/79 | HR 76 | Temp 98.1°F | Ht 66.0 in | Wt 191.8 lb

## 2022-06-22 DIAGNOSIS — K529 Noninfective gastroenteritis and colitis, unspecified: Secondary | ICD-10-CM

## 2022-06-22 MED ORDER — CHOLESTYRAMINE 4 G PO PACK: 4.0000 g | PACK | Freq: Two times a day (BID) | ORAL | 1 refills | Status: DC

## 2022-06-22 MED ORDER — CHOLESTYRAMINE 4 G PO PACK: 4.0000 g | PACK | Freq: Two times a day (BID) | ORAL | 1 refills | Status: AC

## 2022-06-22 NOTE — Progress Notes (Addendum)
Referring Provider: Manon Hilding, MD Primary Care Physician:  Manon Hilding, MD Primary GI Physician: Jenetta Downer   Chief Complaint  Patient presents with   Diarrhea    Follow up on diarrhea and dysphagia. Patient reports she has loose stools after eating. Has cut back on what she eats due to frequent stools. Dysphagia is better and reports not having issues with that since having egd done.    HPI:   [name] is a 72 y.o. female with past medical history of  lupus,hld, rheumatoid arthritis on Tocilizumab, hypertension, diabetes, IBS,   Patient presenting today for follow up.  Last seen August 2023, at that time  ongoing diarrhea and upset stomach for years, though worse recently with more fecal urgency and some fecal incontinence.  having loose to watery stools 4-5x per day, other days she may go less. intermittent formed stools, she may have this for 2-3 days and then go back to diarrhea. She denies weight loss. Does note that diarrhea seems to be shortly after eating. She will use pepto bismol as needed if she is going out which helps some.    Having some occasional dysphagia where food feels stuck in upper chest. Usually can get food to pass. Tends to happen more with drier, thicker foods. This occurs only a few times per month.   Recommended EGD/colonoscopy, Rx bentyl '10mg'$  TID PRN, low fodmap and stool journal.  EGD/Colonoscopy as below, fecal fat/elastase and celiac panel ordered thereafter which were all normal/negative.   Present:  Still having post prandial diarrhea, loose to watery stools, 20-60 minutes after eating. She eats 1 meal per day most days to avoid this. Denies rectal bleeding or melena. She has had some abdominal discomfort just under her umbilicus. She will take pepto bismol if she is going out which will then cause her some constipation for the next day or so. She tried dicyclomine before meals but she is not sure she saw much difference, often forgets to take  it before she eats. She has not done the low fodmap food guide, did keep a food stool journal without much pattern noted. Has also tried to see if it correlates with her meds but hasn't noticed much relationship. She is still having fecal urgency.   Last Colonoscopy:02/16/22  The examined portion of the ileum was normal. - Diverticulosis in the sigmoid colon. Biopsied-normal  - Non-bleeding internal hemorrhoids.  Last Upper Endoscopy:02/16/22- No endoscopic esophageal abnormality to explain  patient's dysphagia. Esophagus dilated. Dilated. - 2 cm hiatal hernia. - Normal stomach. - Normal examined duodenum. - No specimens collected.  Recommendations:    Past Medical History:  Diagnosis Date   Anxiety    Atypical mole 11/19/2012   Mid Right Back (moderate to severe) (wider shave)   Basal cell carcinoma    Basal cell carcinoma 01/25/2021   sup-left lower leg (CX35FU)   BCC (basal cell carcinoma of skin) 09/17/2013   Left Inner Lower Leg (Ulcerated) (curet and excision)   Cerebral aneurysm without rupture 09/11/2018   2.5 mm supraclinoid left carotid artery   Complication of anesthesia    difficult to arouse   Diabetes (HCC)    Hypertension    IBS (irritable bowel syndrome)    Lupus (HCC)    Migraine headache 09/11/2018   Neuropathy    PONV (postoperative nausea and vomiting)    Rheumatoid arthritis (HCC)    SCC (squamous cell carcinoma) 01/25/2021   in situ- right lower inner leg(CX35FU)  Sleep apnea    Squamous cell carcinoma of skin 10/27/2015   Left Inner Calf (in situ) (curet, 5FU and cautery)   Superficial basal cell carcinoma (BCC) 10/14/2008   Left Inner Lower Leg (Zyclara, LN2)   Superficial basal cell carcinoma (BCC) 10/14/2008   Left Post Inf Calf (Zyclara, LN2)   Superficial basal cell carcinoma (BCC) 10/14/2008   Left Post Sup Calf (Zyclara, LN2)   Superficial basal cell carcinoma (BCC) 09/17/2013   Left Post Inf Calf (curet and 5FU)   Tremor 02/08/2021     Past Surgical History:  Procedure Laterality Date   BIOPSY N/A 02/16/2022   Procedure: RANDOM BIOPSYS;  Surgeon: Harvel Quale, MD;  [facility];  Service: Gastroenterology;  Laterality: N/A;   BIOPSY BREAST Right    negative   BLADDER SUSPENSION     BLADDER SUSPENSION     COLONOSCOPY WITH PROPOFOL N/A 02/16/2022   Procedure: COLONOSCOPY WITH PROPOFOL;  Surgeon: Harvel Quale, MD;  [facility];  Service: Gastroenterology;  Laterality: N/A;  1015 ASA 3   ESOPHAGOGASTRODUODENOSCOPY (EGD) WITH PROPOFOL  02/16/2022   Procedure: ESOPHAGOGASTRODUODENOSCOPY (EGD) WITH PROPOFOL;  Surgeon: Harvel Quale, MD;  [facility];  Service: Gastroenterology;;   GALLBLADDER SURGERY     SAVORY DILATION  02/16/2022   Procedure: SAVORY DILATION;  Surgeon: Montez Morita, Quillian Quince, MD;  [facility]: [facility];  Service: Gastroenterology;;   TENDON EXPLORATION     arms/hand    Current Outpatient Medications  Medication Sig Dispense Refill   acetaminophen (TYLENOL) 500 MG tablet Take 500-1,000 mg by mouth every 6 (six) hours as needed (pain.).     bismuth subsalicylate (PEPTO BISMOL) 262 MG/15ML suspension Take 15-20 mLs by mouth daily as needed (upset stomach).      calcium carbonate (TUMS - DOSED IN MG ELEMENTAL CALCIUM) 500 MG chewable tablet Chew 2 tablets by mouth 2 (two) times daily as needed for indigestion or heartburn.     Cholecalciferol (VITAMIN D3) 50 MCG (2000 UT) TABS Take 2,000 Units by mouth in the morning.     dicyclomine (BENTYL) 10 MG capsule Take 1 capsule (10 mg total) by mouth 3 (three) times daily as needed for spasms. 90 capsule 1   FLUoxetine (PROZAC) 20 MG capsule Take 20-40 mg by mouth See admin instructions. Take 1 capsule (20 mg) by mouth every other day; alternating with taking 2 tablets (40 mg) by mouth every other day.  2   hydroxychloroquine (PLAQUENIL) 200 MG tablet Take 400 mg by mouth in the morning.      lisinopril-hydrochlorothiazide (PRINZIDE,ZESTORETIC) 10-12.5 MG tablet Take 1 tablet by mouth in the morning.     pantoprazole (PROTONIX) 40 MG tablet Take 40 mg by mouth 2 (two) times daily.     pioglitazone (ACTOS) 15 MG tablet Take 15 mg by mouth in the morning.  6   tocilizumab (ACTEMRA) 400 MG/20ML SOLN injection Inject 400 mg into the vein every 30 (thirty) days.      topiramate (TOPAMAX) 25 MG tablet Take 3 tablets (75 mg total) by mouth at bedtime. 270 tablet 3   zolpidem (AMBIEN) 10 MG tablet Take 5 mg by mouth at bedtime.     No current facility-administered medications for this visit.    Allergies as of 06/22/2022 - Review Complete 06/22/2022  Allergen Reaction Noted   Arava [leflunomide] Other (See Comments) 02/07/2022   Clindamycin/lincomycin Hives 09/07/2017   Mestinon [pyridostigmine]  02/08/2021   Methotrexate Other (See Comments)  02/07/2022   Sulfa antibiotics Nausea And Vomiting 09/07/2017    Family History  Problem Relation Age of Onset   Cerebral aneurysm Mother    High blood pressure Mother    Thyroid disease Mother    COPD Father     Social History   Socioeconomic History   Marital status: Married    Spouse name: [name]   Number of children: 2   Years of education: Some college   Highest education level: Not on file  Occupational History   Occupation: Retired  Tobacco Use   Smoking status: Former   Smokeless tobacco: Never  Scientific laboratory technician Use: Never used  Substance and Sexual Activity   Alcohol use: No   Drug use: No   Sexual activity: Not on file  Other Topics Concern   Not on file  Social History Narrative   Lives with husband   Caffeine use: Coffee (2 cups per day)   2 glasses tea per day in summer   Soda sometimes   Right handed   Social Determinants of Health   Financial Resource Strain: Not on file  Food Insecurity: Not on file  Transportation Needs: Not on file  Physical Activity: Not on file  Stress: Not on file  Social  Connections: Not on file    Review of systems General: negative for malaise, night sweats, fever, chills, weight loss Neck: Negative for lumps, goiter, pain and significant neck swelling Resp: Negative for cough, wheezing, dyspnea at rest CV: Negative for chest pain, leg swelling, palpitations, orthopnea GI: denies melena, hematochezia, nausea, vomiting, constipation, dysphagia, odyonophagia, early satiety or unintentional weight loss. +diarrhea MSK: Negative for joint pain or swelling, back pain, and muscle pain. Derm: Negative for itching or rash Psych: Denies depression, anxiety, memory loss, confusion. No homicidal or suicidal ideation.  Heme: Negative for prolonged bleeding, bruising easily, and swollen nodes. Endocrine: Negative for cold or heat intolerance, polyuria, polydipsia and goiter. Neuro: negative for tremor, gait imbalance, syncope and seizures. The remainder of the review of systems is noncontributory.  Physical Exam: There were no vitals taken for this visit. General:   Alert and oriented. No distress noted. Pleasant and cooperative.  Head:  Normocephalic and atraumatic. Eyes:  Conjuctiva clear without scleral icterus. Mouth:  Oral mucosa pink and moist. Good dentition. No lesions. Heart: Normal rate and rhythm, s1 and s2 heart sounds present.  Lungs: Clear lung sounds in all lobes. Respirations equal and unlabored. Abdomen:  +BS, soft, non-tender and non-distended. No rebound or guarding. No HSM or masses noted. Derm: No palmar erythema or jaundice Msk:  Symmetrical without gross deformities. Normal posture. Extremities:  Without edema. Neurologic:  Alert and  oriented x4 Psych:  Alert and cooperative. Normal mood and affect.  Invalid input(s): "6 MONTHS"   ASSESSMENT: ANAIKA SANTILLANO is a 72 y.o. female presenting today for follow up of diarrhea.  Continues to have post prandial diarrhea, workup thus far, as outlined above, has been unrevealing. Bentyl has not  provided much relief to her. She does not think she has tried the low FODMAP diet, will provide this today. Given she is s/p cholecystectomy, should consider some aspect of BAD. Will start cholestyramine 4g BID. She was advised to take this 1 hour before or 4 hours after other medications. She should call me with an update in 2 weeks on how symptoms are doing.    PLAN:  Cholestyramine 4g BID  2. Low fodmap diet 3. Call in 2 weeks  with update on how symptoms are   All questions were answered, patient verbalized understanding and is in agreement with plan as outlined above.    Follow Up: 3 months  Jemma Rasp L. Alver Sorrow, MSN, APRN, AGNP-C Adult-Gerontology Nurse Practitioner [facility]  I have reviewed the note and agree with the APP's assessment as described in this progress note  [name], MD Gastroenterology and Hepatology [facility]

## 2022-06-22 NOTE — Patient Instructions (Signed)
Lets start cholestyramine 4g twice daily. Please take this 1 hour before or 4 hours after any other medications. Please call with an update in 2 weeks on how symptoms are doing I will also provide the low FODMAP food guide for you to look at   Follow up 3 months

## 2022-07-17 ENCOUNTER — Telehealth (INDEPENDENT_AMBULATORY_CARE_PROVIDER_SITE_OTHER): Payer: Self-pay | Admitting: *Deleted

## 2022-07-17 DIAGNOSIS — Z79899 Other long term (current) drug therapy: Secondary | ICD-10-CM | POA: Diagnosis not present

## 2022-07-17 DIAGNOSIS — M0579 Rheumatoid arthritis with rheumatoid factor of multiple sites without organ or systems involvement: Secondary | ICD-10-CM | POA: Diagnosis not present

## 2022-07-17 NOTE — Telephone Encounter (Signed)
Patient seen 1/11 and calling with update. She reports she has not taken med prescribed but did change her diet. She reports she is about 30 - 35% by changing her diet. She would like to just continue diet for awhile to see if she can get more improvement without the med. She wanted to know chelsea's thoughts on this and also if she needed to keep follow up on 4/15 or if she could push out her appt.   [phone]  Patient will be out of town today and asked for a call back tomorrow when she will be home.

## 2022-07-18 NOTE — Telephone Encounter (Signed)
Discussed with patient per [facility] -  If she feels she is doing better with changing her diet, she is fine to hold off on the medication. We could push her out to the 6 month mark if she wants to hold off on coming back in for follow up sooner.  Patient verbalized understanding and she wanted to keep April appt at this time and will call back when its closer to move out if she is still doing well.

## 2022-08-14 DIAGNOSIS — E559 Vitamin D deficiency, unspecified: Secondary | ICD-10-CM | POA: Diagnosis not present

## 2022-08-14 DIAGNOSIS — E7849 Other hyperlipidemia: Secondary | ICD-10-CM | POA: Diagnosis not present

## 2022-08-14 DIAGNOSIS — E1143 Type 2 diabetes mellitus with diabetic autonomic (poly)neuropathy: Secondary | ICD-10-CM | POA: Diagnosis not present

## 2022-08-14 DIAGNOSIS — M0579 Rheumatoid arthritis with rheumatoid factor of multiple sites without organ or systems involvement: Secondary | ICD-10-CM | POA: Diagnosis not present

## 2022-08-14 DIAGNOSIS — E1122 Type 2 diabetes mellitus with diabetic chronic kidney disease: Secondary | ICD-10-CM | POA: Diagnosis not present

## 2022-08-14 DIAGNOSIS — E1165 Type 2 diabetes mellitus with hyperglycemia: Secondary | ICD-10-CM | POA: Diagnosis not present

## 2022-08-14 DIAGNOSIS — K2100 Gastro-esophageal reflux disease with esophagitis, without bleeding: Secondary | ICD-10-CM | POA: Diagnosis not present

## 2022-08-14 DIAGNOSIS — E782 Mixed hyperlipidemia: Secondary | ICD-10-CM | POA: Diagnosis not present

## 2022-08-22 DIAGNOSIS — M3210 Systemic lupus erythematosus, organ or system involvement unspecified: Secondary | ICD-10-CM | POA: Diagnosis not present

## 2022-08-22 DIAGNOSIS — G619 Inflammatory polyneuropathy, unspecified: Secondary | ICD-10-CM | POA: Diagnosis not present

## 2022-08-22 DIAGNOSIS — N1831 Chronic kidney disease, stage 3a: Secondary | ICD-10-CM | POA: Diagnosis not present

## 2022-08-22 DIAGNOSIS — K580 Irritable bowel syndrome with diarrhea: Secondary | ICD-10-CM | POA: Diagnosis not present

## 2022-08-22 DIAGNOSIS — E1143 Type 2 diabetes mellitus with diabetic autonomic (poly)neuropathy: Secondary | ICD-10-CM | POA: Diagnosis not present

## 2022-08-22 DIAGNOSIS — D696 Thrombocytopenia, unspecified: Secondary | ICD-10-CM | POA: Diagnosis not present

## 2022-08-22 DIAGNOSIS — Z0001 Encounter for general adult medical examination with abnormal findings: Secondary | ICD-10-CM | POA: Diagnosis not present

## 2022-08-22 DIAGNOSIS — E1165 Type 2 diabetes mellitus with hyperglycemia: Secondary | ICD-10-CM | POA: Diagnosis not present

## 2022-08-22 DIAGNOSIS — I10 Essential (primary) hypertension: Secondary | ICD-10-CM | POA: Diagnosis not present

## 2022-08-22 DIAGNOSIS — Z23 Encounter for immunization: Secondary | ICD-10-CM | POA: Diagnosis not present

## 2022-08-22 DIAGNOSIS — D72819 Decreased white blood cell count, unspecified: Secondary | ICD-10-CM | POA: Diagnosis not present

## 2022-08-22 DIAGNOSIS — G629 Polyneuropathy, unspecified: Secondary | ICD-10-CM | POA: Diagnosis not present

## 2022-08-29 DIAGNOSIS — Z1231 Encounter for screening mammogram for malignant neoplasm of breast: Secondary | ICD-10-CM | POA: Diagnosis not present

## 2022-09-11 DIAGNOSIS — M0579 Rheumatoid arthritis with rheumatoid factor of multiple sites without organ or systems involvement: Secondary | ICD-10-CM | POA: Diagnosis not present

## 2022-09-25 ENCOUNTER — Ambulatory Visit (INDEPENDENT_AMBULATORY_CARE_PROVIDER_SITE_OTHER): Payer: Medicare Other | Admitting: Gastroenterology

## 2022-10-09 DIAGNOSIS — M0579 Rheumatoid arthritis with rheumatoid factor of multiple sites without organ or systems involvement: Secondary | ICD-10-CM | POA: Diagnosis not present

## 2022-11-08 DIAGNOSIS — Z6831 Body mass index (BMI) 31.0-31.9, adult: Secondary | ICD-10-CM | POA: Diagnosis not present

## 2022-11-08 DIAGNOSIS — M1991 Primary osteoarthritis, unspecified site: Secondary | ICD-10-CM | POA: Diagnosis not present

## 2022-11-08 DIAGNOSIS — E669 Obesity, unspecified: Secondary | ICD-10-CM | POA: Diagnosis not present

## 2022-11-08 DIAGNOSIS — Z79899 Other long term (current) drug therapy: Secondary | ICD-10-CM | POA: Diagnosis not present

## 2022-11-08 DIAGNOSIS — M329 Systemic lupus erythematosus, unspecified: Secondary | ICD-10-CM | POA: Diagnosis not present

## 2022-11-08 DIAGNOSIS — R5383 Other fatigue: Secondary | ICD-10-CM | POA: Diagnosis not present

## 2022-11-08 DIAGNOSIS — K529 Noninfective gastroenteritis and colitis, unspecified: Secondary | ICD-10-CM | POA: Diagnosis not present

## 2022-11-08 DIAGNOSIS — M3501 Sicca syndrome with keratoconjunctivitis: Secondary | ICD-10-CM | POA: Diagnosis not present

## 2022-11-08 DIAGNOSIS — M0579 Rheumatoid arthritis with rheumatoid factor of multiple sites without organ or systems involvement: Secondary | ICD-10-CM | POA: Diagnosis not present

## 2022-11-13 DIAGNOSIS — M0579 Rheumatoid arthritis with rheumatoid factor of multiple sites without organ or systems involvement: Secondary | ICD-10-CM | POA: Diagnosis not present

## 2022-12-11 DIAGNOSIS — M0579 Rheumatoid arthritis with rheumatoid factor of multiple sites without organ or systems involvement: Secondary | ICD-10-CM | POA: Diagnosis not present

## 2022-12-22 DIAGNOSIS — E781 Pure hyperglyceridemia: Secondary | ICD-10-CM | POA: Diagnosis not present

## 2022-12-22 DIAGNOSIS — K2100 Gastro-esophageal reflux disease with esophagitis, without bleeding: Secondary | ICD-10-CM | POA: Diagnosis not present

## 2022-12-22 DIAGNOSIS — E7849 Other hyperlipidemia: Secondary | ICD-10-CM | POA: Diagnosis not present

## 2022-12-22 DIAGNOSIS — E1122 Type 2 diabetes mellitus with diabetic chronic kidney disease: Secondary | ICD-10-CM | POA: Diagnosis not present

## 2022-12-22 DIAGNOSIS — E559 Vitamin D deficiency, unspecified: Secondary | ICD-10-CM | POA: Diagnosis not present

## 2022-12-22 DIAGNOSIS — R5383 Other fatigue: Secondary | ICD-10-CM | POA: Diagnosis not present

## 2022-12-25 DIAGNOSIS — I10 Essential (primary) hypertension: Secondary | ICD-10-CM | POA: Diagnosis not present

## 2022-12-25 DIAGNOSIS — D696 Thrombocytopenia, unspecified: Secondary | ICD-10-CM | POA: Diagnosis not present

## 2022-12-25 DIAGNOSIS — R197 Diarrhea, unspecified: Secondary | ICD-10-CM | POA: Diagnosis not present

## 2022-12-25 DIAGNOSIS — E7849 Other hyperlipidemia: Secondary | ICD-10-CM | POA: Diagnosis not present

## 2022-12-25 DIAGNOSIS — E1165 Type 2 diabetes mellitus with hyperglycemia: Secondary | ICD-10-CM | POA: Diagnosis not present

## 2022-12-25 DIAGNOSIS — N1831 Chronic kidney disease, stage 3a: Secondary | ICD-10-CM | POA: Diagnosis not present

## 2022-12-25 DIAGNOSIS — K580 Irritable bowel syndrome with diarrhea: Secondary | ICD-10-CM | POA: Diagnosis not present

## 2022-12-25 DIAGNOSIS — G619 Inflammatory polyneuropathy, unspecified: Secondary | ICD-10-CM | POA: Diagnosis not present

## 2022-12-25 DIAGNOSIS — D72819 Decreased white blood cell count, unspecified: Secondary | ICD-10-CM | POA: Diagnosis not present

## 2022-12-25 DIAGNOSIS — M3210 Systemic lupus erythematosus, organ or system involvement unspecified: Secondary | ICD-10-CM | POA: Diagnosis not present

## 2022-12-25 DIAGNOSIS — D649 Anemia, unspecified: Secondary | ICD-10-CM | POA: Diagnosis not present

## 2022-12-25 DIAGNOSIS — E1143 Type 2 diabetes mellitus with diabetic autonomic (poly)neuropathy: Secondary | ICD-10-CM | POA: Diagnosis not present

## 2023-01-08 DIAGNOSIS — M0579 Rheumatoid arthritis with rheumatoid factor of multiple sites without organ or systems involvement: Secondary | ICD-10-CM | POA: Diagnosis not present

## 2023-01-24 DIAGNOSIS — L570 Actinic keratosis: Secondary | ICD-10-CM | POA: Diagnosis not present

## 2023-01-24 DIAGNOSIS — X32XXXD Exposure to sunlight, subsequent encounter: Secondary | ICD-10-CM | POA: Diagnosis not present

## 2023-01-24 DIAGNOSIS — C44319 Basal cell carcinoma of skin of other parts of face: Secondary | ICD-10-CM | POA: Diagnosis not present

## 2023-02-05 DIAGNOSIS — Z79899 Other long term (current) drug therapy: Secondary | ICD-10-CM | POA: Diagnosis not present

## 2023-02-05 DIAGNOSIS — R5383 Other fatigue: Secondary | ICD-10-CM | POA: Diagnosis not present

## 2023-02-05 DIAGNOSIS — Z111 Encounter for screening for respiratory tuberculosis: Secondary | ICD-10-CM | POA: Diagnosis not present

## 2023-02-05 DIAGNOSIS — M329 Systemic lupus erythematosus, unspecified: Secondary | ICD-10-CM | POA: Diagnosis not present

## 2023-02-05 DIAGNOSIS — M0579 Rheumatoid arthritis with rheumatoid factor of multiple sites without organ or systems involvement: Secondary | ICD-10-CM | POA: Diagnosis not present

## 2023-03-07 DIAGNOSIS — M0579 Rheumatoid arthritis with rheumatoid factor of multiple sites without organ or systems involvement: Secondary | ICD-10-CM | POA: Diagnosis not present

## 2023-04-04 DIAGNOSIS — M0579 Rheumatoid arthritis with rheumatoid factor of multiple sites without organ or systems involvement: Secondary | ICD-10-CM | POA: Diagnosis not present

## 2023-04-16 ENCOUNTER — Encounter (INDEPENDENT_AMBULATORY_CARE_PROVIDER_SITE_OTHER): Payer: Medicare Other | Admitting: Ophthalmology

## 2023-04-27 DIAGNOSIS — E7849 Other hyperlipidemia: Secondary | ICD-10-CM | POA: Diagnosis not present

## 2023-04-27 DIAGNOSIS — N1831 Chronic kidney disease, stage 3a: Secondary | ICD-10-CM | POA: Diagnosis not present

## 2023-04-27 DIAGNOSIS — E559 Vitamin D deficiency, unspecified: Secondary | ICD-10-CM | POA: Diagnosis not present

## 2023-04-27 DIAGNOSIS — E1143 Type 2 diabetes mellitus with diabetic autonomic (poly)neuropathy: Secondary | ICD-10-CM | POA: Diagnosis not present

## 2023-05-01 DIAGNOSIS — E669 Obesity, unspecified: Secondary | ICD-10-CM | POA: Diagnosis not present

## 2023-05-01 DIAGNOSIS — M1991 Primary osteoarthritis, unspecified site: Secondary | ICD-10-CM | POA: Diagnosis not present

## 2023-05-01 DIAGNOSIS — Z79899 Other long term (current) drug therapy: Secondary | ICD-10-CM | POA: Diagnosis not present

## 2023-05-01 DIAGNOSIS — Z6830 Body mass index (BMI) 30.0-30.9, adult: Secondary | ICD-10-CM | POA: Diagnosis not present

## 2023-05-01 DIAGNOSIS — M3501 Sicca syndrome with keratoconjunctivitis: Secondary | ICD-10-CM | POA: Diagnosis not present

## 2023-05-01 DIAGNOSIS — E1143 Type 2 diabetes mellitus with diabetic autonomic (poly)neuropathy: Secondary | ICD-10-CM | POA: Diagnosis not present

## 2023-05-01 DIAGNOSIS — I10 Essential (primary) hypertension: Secondary | ICD-10-CM | POA: Diagnosis not present

## 2023-05-01 DIAGNOSIS — E1165 Type 2 diabetes mellitus with hyperglycemia: Secondary | ICD-10-CM | POA: Diagnosis not present

## 2023-05-01 DIAGNOSIS — D696 Thrombocytopenia, unspecified: Secondary | ICD-10-CM | POA: Diagnosis not present

## 2023-05-01 DIAGNOSIS — Z23 Encounter for immunization: Secondary | ICD-10-CM | POA: Diagnosis not present

## 2023-05-01 DIAGNOSIS — R1011 Right upper quadrant pain: Secondary | ICD-10-CM | POA: Diagnosis not present

## 2023-05-01 DIAGNOSIS — D72819 Decreased white blood cell count, unspecified: Secondary | ICD-10-CM | POA: Diagnosis not present

## 2023-05-01 DIAGNOSIS — R197 Diarrhea, unspecified: Secondary | ICD-10-CM | POA: Diagnosis not present

## 2023-05-01 DIAGNOSIS — D649 Anemia, unspecified: Secondary | ICD-10-CM | POA: Diagnosis not present

## 2023-05-01 DIAGNOSIS — M329 Systemic lupus erythematosus, unspecified: Secondary | ICD-10-CM | POA: Diagnosis not present

## 2023-05-01 DIAGNOSIS — R519 Headache, unspecified: Secondary | ICD-10-CM | POA: Diagnosis not present

## 2023-05-01 DIAGNOSIS — G619 Inflammatory polyneuropathy, unspecified: Secondary | ICD-10-CM | POA: Diagnosis not present

## 2023-05-01 DIAGNOSIS — G629 Polyneuropathy, unspecified: Secondary | ICD-10-CM | POA: Diagnosis not present

## 2023-05-01 DIAGNOSIS — M0579 Rheumatoid arthritis with rheumatoid factor of multiple sites without organ or systems involvement: Secondary | ICD-10-CM | POA: Diagnosis not present

## 2023-05-02 DIAGNOSIS — R1011 Right upper quadrant pain: Secondary | ICD-10-CM | POA: Diagnosis not present

## 2023-05-02 DIAGNOSIS — M3210 Systemic lupus erythematosus, organ or system involvement unspecified: Secondary | ICD-10-CM | POA: Diagnosis not present

## 2023-05-02 DIAGNOSIS — D1803 Hemangioma of intra-abdominal structures: Secondary | ICD-10-CM | POA: Diagnosis not present

## 2023-05-02 DIAGNOSIS — R197 Diarrhea, unspecified: Secondary | ICD-10-CM | POA: Diagnosis not present

## 2023-05-08 DIAGNOSIS — M0579 Rheumatoid arthritis with rheumatoid factor of multiple sites without organ or systems involvement: Secondary | ICD-10-CM | POA: Diagnosis not present

## 2023-06-25 DIAGNOSIS — M0579 Rheumatoid arthritis with rheumatoid factor of multiple sites without organ or systems involvement: Secondary | ICD-10-CM | POA: Diagnosis not present

## 2023-07-24 DIAGNOSIS — M0579 Rheumatoid arthritis with rheumatoid factor of multiple sites without organ or systems involvement: Secondary | ICD-10-CM | POA: Diagnosis not present

## 2023-07-24 DIAGNOSIS — Z79899 Other long term (current) drug therapy: Secondary | ICD-10-CM | POA: Diagnosis not present

## 2023-08-07 DIAGNOSIS — Z23 Encounter for immunization: Secondary | ICD-10-CM | POA: Diagnosis not present

## 2023-08-21 DIAGNOSIS — E1122 Type 2 diabetes mellitus with diabetic chronic kidney disease: Secondary | ICD-10-CM | POA: Diagnosis not present

## 2023-08-21 DIAGNOSIS — E559 Vitamin D deficiency, unspecified: Secondary | ICD-10-CM | POA: Diagnosis not present

## 2023-08-21 DIAGNOSIS — I10 Essential (primary) hypertension: Secondary | ICD-10-CM | POA: Diagnosis not present

## 2023-08-21 DIAGNOSIS — D696 Thrombocytopenia, unspecified: Secondary | ICD-10-CM | POA: Diagnosis not present

## 2023-08-21 DIAGNOSIS — R5383 Other fatigue: Secondary | ICD-10-CM | POA: Diagnosis not present

## 2023-08-21 DIAGNOSIS — E7849 Other hyperlipidemia: Secondary | ICD-10-CM | POA: Diagnosis not present

## 2023-08-21 DIAGNOSIS — N183 Chronic kidney disease, stage 3 unspecified: Secondary | ICD-10-CM | POA: Diagnosis not present

## 2023-08-21 DIAGNOSIS — D72819 Decreased white blood cell count, unspecified: Secondary | ICD-10-CM | POA: Diagnosis not present

## 2023-08-21 DIAGNOSIS — M0579 Rheumatoid arthritis with rheumatoid factor of multiple sites without organ or systems involvement: Secondary | ICD-10-CM | POA: Diagnosis not present

## 2023-08-28 DIAGNOSIS — Z1331 Encounter for screening for depression: Secondary | ICD-10-CM | POA: Diagnosis not present

## 2023-08-28 DIAGNOSIS — D72819 Decreased white blood cell count, unspecified: Secondary | ICD-10-CM | POA: Diagnosis not present

## 2023-08-28 DIAGNOSIS — Z0001 Encounter for general adult medical examination with abnormal findings: Secondary | ICD-10-CM | POA: Diagnosis not present

## 2023-08-28 DIAGNOSIS — Z0000 Encounter for general adult medical examination without abnormal findings: Secondary | ICD-10-CM | POA: Diagnosis not present

## 2023-08-28 DIAGNOSIS — Z1389 Encounter for screening for other disorder: Secondary | ICD-10-CM | POA: Diagnosis not present

## 2023-08-28 DIAGNOSIS — I10 Essential (primary) hypertension: Secondary | ICD-10-CM | POA: Diagnosis not present

## 2023-08-28 DIAGNOSIS — D696 Thrombocytopenia, unspecified: Secondary | ICD-10-CM | POA: Diagnosis not present

## 2023-08-28 DIAGNOSIS — Z6831 Body mass index (BMI) 31.0-31.9, adult: Secondary | ICD-10-CM | POA: Diagnosis not present

## 2023-08-28 DIAGNOSIS — E782 Mixed hyperlipidemia: Secondary | ICD-10-CM | POA: Diagnosis not present

## 2023-09-06 DIAGNOSIS — Z1231 Encounter for screening mammogram for malignant neoplasm of breast: Secondary | ICD-10-CM | POA: Diagnosis not present

## 2023-09-18 DIAGNOSIS — M0579 Rheumatoid arthritis with rheumatoid factor of multiple sites without organ or systems involvement: Secondary | ICD-10-CM | POA: Diagnosis not present

## 2023-10-16 DIAGNOSIS — M0579 Rheumatoid arthritis with rheumatoid factor of multiple sites without organ or systems involvement: Secondary | ICD-10-CM | POA: Diagnosis not present

## 2023-10-16 DIAGNOSIS — Z79899 Other long term (current) drug therapy: Secondary | ICD-10-CM | POA: Diagnosis not present

## 2023-10-30 DIAGNOSIS — M0579 Rheumatoid arthritis with rheumatoid factor of multiple sites without organ or systems involvement: Secondary | ICD-10-CM | POA: Diagnosis not present

## 2023-10-30 DIAGNOSIS — E669 Obesity, unspecified: Secondary | ICD-10-CM | POA: Diagnosis not present

## 2023-10-30 DIAGNOSIS — M329 Systemic lupus erythematosus, unspecified: Secondary | ICD-10-CM | POA: Diagnosis not present

## 2023-10-30 DIAGNOSIS — D708 Other neutropenia: Secondary | ICD-10-CM | POA: Diagnosis not present

## 2023-10-30 DIAGNOSIS — R5383 Other fatigue: Secondary | ICD-10-CM | POA: Diagnosis not present

## 2023-10-30 DIAGNOSIS — M3501 Sicca syndrome with keratoconjunctivitis: Secondary | ICD-10-CM | POA: Diagnosis not present

## 2023-10-30 DIAGNOSIS — Z6832 Body mass index (BMI) 32.0-32.9, adult: Secondary | ICD-10-CM | POA: Diagnosis not present

## 2023-10-30 DIAGNOSIS — Z79899 Other long term (current) drug therapy: Secondary | ICD-10-CM | POA: Diagnosis not present

## 2023-10-30 DIAGNOSIS — M1991 Primary osteoarthritis, unspecified site: Secondary | ICD-10-CM | POA: Diagnosis not present

## 2023-10-31 ENCOUNTER — Telehealth (INDEPENDENT_AMBULATORY_CARE_PROVIDER_SITE_OTHER): Payer: Self-pay | Admitting: Otolaryngology

## 2023-10-31 NOTE — Telephone Encounter (Signed)
 Called and spoke w/ patient to confirm appt date, time and location; Text also sent

## 2023-11-02 ENCOUNTER — Encounter (INDEPENDENT_AMBULATORY_CARE_PROVIDER_SITE_OTHER): Payer: Self-pay | Admitting: Otolaryngology

## 2023-11-02 ENCOUNTER — Ambulatory Visit (INDEPENDENT_AMBULATORY_CARE_PROVIDER_SITE_OTHER): Admitting: Otolaryngology

## 2023-11-02 VITALS — BP 187/75 | HR 85 | Ht 66.0 in | Wt 198.0 lb

## 2023-11-02 DIAGNOSIS — K130 Diseases of lips: Secondary | ICD-10-CM

## 2023-11-02 DIAGNOSIS — R229 Localized swelling, mass and lump, unspecified: Secondary | ICD-10-CM

## 2023-11-02 DIAGNOSIS — D3701 Neoplasm of uncertain behavior of lip: Secondary | ICD-10-CM

## 2023-11-02 NOTE — Progress Notes (Signed)
 ENT CONSULT:  Reason for Consult: left upper lip lesion possible cyst   HPI: Discussed the use of AI scribe software for clinical note transcription with the patient, who gave verbal consent to proceed.  History of Present Illness [name] is a 73 year old female with hx of lupus who presents with upper lip lesion/mass. She was referred by her dermatologist for evaluation of the facial mass since there are not overlying skin changes and it appears to be confined to the underlying deep soft tissue planes.   She first noticed it approximately one year ago, describes it as a 'knot' that occasionally enlarges and sometimes exhibits a blue tinge. The swelling is intermittently palpable and can be painful upon pressure. It fluctuates in size, and at its largest, it feels as though it has grown slightly.  She consulted her dermatologist, who was unable to determine the cause and recommended an ENT evaluation. Her dentist also examined the area and found no issues related to her oral health.   No numbness or tingling is associated with the swelling. No facial muscle weakness    Past Medical History:  Diagnosis Date   Anxiety    Atypical mole 11/19/2012   Mid Right Back (moderate to severe) (wider shave)   Basal cell carcinoma    Basal cell carcinoma 01/25/2021   sup-left lower leg (CX35FU)   BCC (basal cell carcinoma of skin) 09/17/2013   Left Inner Lower Leg (Ulcerated) (curet and excision)   Cerebral aneurysm without rupture 09/11/2018   2.5 mm supraclinoid left carotid artery   Complication of anesthesia    difficult to arouse   Diabetes (HCC)    Hypertension    IBS (irritable bowel syndrome)    Lupus    Migraine headache 09/11/2018   Neuropathy    PONV (postoperative nausea and vomiting)    Rheumatoid arthritis (HCC)    SCC (squamous cell carcinoma) 01/25/2021   in situ- right lower inner leg(CX35FU)   Sleep apnea    Squamous cell carcinoma of skin 10/27/2015   Left Inner  Calf (in situ) (curet, 5FU and cautery)   Superficial basal cell carcinoma (BCC) 10/14/2008   Left Inner Lower Leg (Zyclara, LN2)   Superficial basal cell carcinoma (BCC) 10/14/2008   Left Post Inf Calf (Zyclara, LN2)   Superficial basal cell carcinoma (BCC) 10/14/2008   Left Post Sup Calf (Zyclara, LN2)   Superficial basal cell carcinoma (BCC) 09/17/2013   Left Post Inf Calf (curet and 5FU)   Tremor 02/08/2021    Past Surgical History:  Procedure Laterality Date   BIOPSY N/A 02/16/2022   Procedure: RANDOM BIOPSYS;  Surgeon: Urban Garden, MD;  [facility];  Service: Gastroenterology;  Laterality: N/A;   BIOPSY BREAST Right    negative   BLADDER SUSPENSION     BLADDER SUSPENSION     COLONOSCOPY WITH PROPOFOL  N/A 02/16/2022   Procedure: COLONOSCOPY WITH PROPOFOL ;  Surgeon: Urban Garden, MD;  [facility];  Service: Gastroenterology;  Laterality: N/A;  1015 ASA 3   ESOPHAGOGASTRODUODENOSCOPY (EGD) WITH PROPOFOL   02/16/2022   Procedure: ESOPHAGOGASTRODUODENOSCOPY (EGD) WITH PROPOFOL ;  Surgeon: Urban Garden, MD;  [facility];  Service: Gastroenterology;;   GALLBLADDER SURGERY     SAVORY DILATION  02/16/2022   Procedure: SAVORY DILATION;  Surgeon: Urban Garden, MD;  [facility];  Service: Gastroenterology;;   TENDON EXPLORATION     arms/hand    Family History  Problem  Relation Age of Onset   Cerebral aneurysm Mother    High blood pressure Mother    Thyroid  disease Mother    COPD Father     Social History:  reports that she has quit smoking. She has been exposed to tobacco smoke. She has never used smokeless tobacco. She reports that she does not drink alcohol  and does not use drugs.  Allergies:  Allergies  Allergen Reactions   Arava [Leflunomide] Other (See Comments)    hairloss   Clindamycin/Lincomycin Hives   Mestinon  [Pyridostigmine ]     GI issues    Methotrexate Other (See  Comments)     leucopenia, hairloss   Sulfa Antibiotics Nausea And Vomiting    Medications: I have reviewed the patient's current medications.  The PMH, PSH, Medications, Allergies, and SH were reviewed and updated.  ROS: Constitutional: Negative for fever, weight loss and weight gain. Cardiovascular: Negative for chest pain and dyspnea on exertion. Respiratory: Is not experiencing shortness of breath at rest. Gastrointestinal: Negative for nausea and vomiting. Neurological: Negative for headaches. Psychiatric: The patient is not nervous/anxious  Blood pressure (!) 187/75, pulse 85, height 5\' 6"  (1.676 m), weight 198 lb (89.8 kg), SpO2 91%. Body mass index is 31.96 kg/m.  PHYSICAL EXAM:  Exam: General: Well-developed, well-nourished Respiratory Respiratory effort: Equal inspiration and expiration without stridor Cardiovascular Peripheral Vascular: Warm extremities with equal color/perfusion Eyes: No nystagmus with equal extraocular motion bilaterally Neuro/Psych/Balance: Patient oriented to person, place, and time; Appropriate mood and affect; Gait is intact with no imbalance; Cranial nerves I-XII are intact Head and Face Inspection: Normocephalic and atraumatic without mass or lesion Palpation: Facial skeleton intact without bony stepoffs Salivary Glands: No mass or tenderness Facial Strength: Facial motility symmetric and full bilaterally ENT Pinna: External ear intact and fully developed External canal: Canal is patent with intact skin Tympanic Membrane: Clear and mobile External Nose: No scar or anatomic deformity Internal Nose: Septum is relatively straight on anterior rhinoscopy  Lips, Teeth, and gums: Upper lip with a cystic lesion within the deep soft tissues with visible protuberance over it on the skin surface, bluish hue of overlying skin but no surface lesions - with bimanual palpation the lesion is soft and mobile ~ 1.5 cm in size Mucosa and teeth intact and  viable TMJ: No pain to palpation with full mobility Oral cavity/oropharynx: No erythema or exudate, no lesions present Neck Neck and Trachea: Midline trachea without mass or lesion Thyroid : No mass or nodularity Lymphatics: No lymphadenopathy  Studies Reviewed: MRA head 01/11/20  EXAM: MR angiogram of the intracranial arteries   ORDERING CLINICIAN: Brian Campanile, MD CLINICAL HISTORY: 73 year old woman with an intracranial aneurysm COMPARISON FILMS: MRI 02/04/2017   TECHNIQUE: MR angiogram of the head was obtained utilizing 3D time of flight sequences from below the vertebrobasilar junction up to the intracranial vasculature without contrast.  Computerized reconstructions were obtained. CONTRAST: None IMAGING SITE: [facility], [address] [place], [place], [place]   FINDINGS: Source and reconstructed images were reviewed.  The imaged extracranial segements of the internal carotid arteries appear normal.  As also seen on the 02/04/2017 MR angiogram, there is a 2.5 mm outpouching of the left supraclinoid internal carotid artery. This could represent either a small aneurysm or an enlarged arterial infundibulum.  It is unchanged in size and appearance.     The middle cerebral and anterior cerebral arteries appear normal.    In the posterior circulation, the vertebral arteries are co-dominant.  No significant stenosis in the  vertebral arteries.. The posterior cerebral arteries appear normal.     IMPRESSION:  This MR angiogram of the intracranial arteries shows a small 2.5 mm outpouching of the left supraclinoid internal carotid artery. It is unchanged compared to the 02/04/2017 MRA.  This could represent a small aneurysm or an enlarged arterial infundibulum.  Esophagram 12/24/19 FINDINGS: No mass or stricture is noted in the esophagus. No hiatal hernia or reflux is noted. Barium tablet passed through esophagus and into stomach without difficulty or delay.   IMPRESSION: No abnormality  seen in the esophagus.  Max/face CT 12/12/2007 Clinical Data: Mass felt by the patient inside the left cheek.    CT MAXILLOFACIAL WITH CONTRAST    Technique:  Multidetector CT imaging of the maxillofacial  structures was performed with intravenous contrast. Multiplanar CT  image reconstructions were also generated.    Contrast: 75 ml Omnipaque-300    Comparison: None.    Findings: There are marked streak artifacts from extensive metallic  dental work obscuring the oropharynx and lower facial structures.  No masses or enlarged lymph nodes are seen elsewhere.  Unremarkable  bones.    IMPRESSION:  Marked streak artifact from extensive metallic dental work  obscuring the area of interest.  No abnormality seen elsewhere.    Assessment/Plan: Encounter Diagnoses  Name Primary?   Mass of lip Yes   Neoplasm of uncertain behavior of upper lip, inner aspect     Assessment and Plan Assessment & Plan Facial mass  Left upper lip mass (deep soft tissues) likely a cyst, likely benign. Imaging required for further characterization. Discussed removal options and potential scarring if removal is desired and required based on imaging results. No facial numbness no weakness, no significant growth over time, all of which are reassuring features.  - Ordered MRI of facial region with contrast  - Coordinate insurance pre-authorization for MRI. - Provided [facility] contact for scheduling. - RTC after imaging   Thank you for allowing me to participate in the care of this patient. Please do not hesitate to contact me with any questions or concerns.   Artice Last, MD [facility] Phone: [phone] Fax: [phone]    11/02/2023, 8:55 AM

## 2023-11-13 DIAGNOSIS — M0579 Rheumatoid arthritis with rheumatoid factor of multiple sites without organ or systems involvement: Secondary | ICD-10-CM | POA: Diagnosis not present

## 2023-12-03 DIAGNOSIS — M0579 Rheumatoid arthritis with rheumatoid factor of multiple sites without organ or systems involvement: Secondary | ICD-10-CM | POA: Diagnosis not present

## 2023-12-10 DIAGNOSIS — H43391 Other vitreous opacities, right eye: Secondary | ICD-10-CM | POA: Diagnosis not present

## 2023-12-11 ENCOUNTER — Ambulatory Visit (HOSPITAL_COMMUNITY)
Admission: RE | Admit: 2023-12-11 | Discharge: 2023-12-11 | Disposition: A | Discharge status: Home / Self Care | Source: Ambulatory Visit | Attending: Otolaryngology | Admitting: Otolaryngology

## 2023-12-11 DIAGNOSIS — K130 Diseases of lips: Secondary | ICD-10-CM | POA: Diagnosis not present

## 2023-12-11 DIAGNOSIS — D3701 Neoplasm of uncertain behavior of lip: Secondary | ICD-10-CM | POA: Diagnosis not present

## 2023-12-11 MED ORDER — GADOBUTROL 1 MMOL/ML IV SOLN
10.0000 mL | Freq: Once | INTRAVENOUS | Status: AC | PRN
  Administered 2023-12-11: 10 mL via INTRAVENOUS

## 2023-12-17 DIAGNOSIS — M0579 Rheumatoid arthritis with rheumatoid factor of multiple sites without organ or systems involvement: Secondary | ICD-10-CM | POA: Diagnosis not present

## 2023-12-24 ENCOUNTER — Encounter (INDEPENDENT_AMBULATORY_CARE_PROVIDER_SITE_OTHER): Admitting: Ophthalmology

## 2023-12-24 DIAGNOSIS — I10 Essential (primary) hypertension: Secondary | ICD-10-CM | POA: Diagnosis not present

## 2023-12-24 DIAGNOSIS — H35033 Hypertensive retinopathy, bilateral: Secondary | ICD-10-CM | POA: Diagnosis not present

## 2023-12-24 DIAGNOSIS — M069 Rheumatoid arthritis, unspecified: Secondary | ICD-10-CM | POA: Diagnosis not present

## 2023-12-24 DIAGNOSIS — H43813 Vitreous degeneration, bilateral: Secondary | ICD-10-CM

## 2023-12-24 DIAGNOSIS — H4423 Degenerative myopia, bilateral: Secondary | ICD-10-CM | POA: Diagnosis not present

## 2023-12-24 DIAGNOSIS — Z79899 Other long term (current) drug therapy: Secondary | ICD-10-CM | POA: Diagnosis not present

## 2024-01-01 DIAGNOSIS — E559 Vitamin D deficiency, unspecified: Secondary | ICD-10-CM | POA: Diagnosis not present

## 2024-01-01 DIAGNOSIS — I10 Essential (primary) hypertension: Secondary | ICD-10-CM | POA: Diagnosis not present

## 2024-01-01 DIAGNOSIS — E1165 Type 2 diabetes mellitus with hyperglycemia: Secondary | ICD-10-CM | POA: Diagnosis not present

## 2024-01-01 DIAGNOSIS — Z1329 Encounter for screening for other suspected endocrine disorder: Secondary | ICD-10-CM | POA: Diagnosis not present

## 2024-01-01 DIAGNOSIS — E7849 Other hyperlipidemia: Secondary | ICD-10-CM | POA: Diagnosis not present

## 2024-01-01 DIAGNOSIS — D72819 Decreased white blood cell count, unspecified: Secondary | ICD-10-CM | POA: Diagnosis not present

## 2024-01-08 DIAGNOSIS — F5101 Primary insomnia: Secondary | ICD-10-CM | POA: Diagnosis not present

## 2024-01-08 DIAGNOSIS — I10 Essential (primary) hypertension: Secondary | ICD-10-CM | POA: Diagnosis not present

## 2024-01-08 DIAGNOSIS — E7849 Other hyperlipidemia: Secondary | ICD-10-CM | POA: Diagnosis not present

## 2024-01-08 DIAGNOSIS — R5383 Other fatigue: Secondary | ICD-10-CM | POA: Diagnosis not present

## 2024-01-08 DIAGNOSIS — E782 Mixed hyperlipidemia: Secondary | ICD-10-CM | POA: Diagnosis not present

## 2024-01-08 DIAGNOSIS — Z6830 Body mass index (BMI) 30.0-30.9, adult: Secondary | ICD-10-CM | POA: Diagnosis not present

## 2024-01-15 DIAGNOSIS — M0579 Rheumatoid arthritis with rheumatoid factor of multiple sites without organ or systems involvement: Secondary | ICD-10-CM | POA: Diagnosis not present

## 2024-01-29 DIAGNOSIS — Z79899 Other long term (current) drug therapy: Secondary | ICD-10-CM | POA: Diagnosis not present

## 2024-01-29 DIAGNOSIS — M0579 Rheumatoid arthritis with rheumatoid factor of multiple sites without organ or systems involvement: Secondary | ICD-10-CM | POA: Diagnosis not present

## 2024-01-29 DIAGNOSIS — Z6832 Body mass index (BMI) 32.0-32.9, adult: Secondary | ICD-10-CM | POA: Diagnosis not present

## 2024-01-29 DIAGNOSIS — D708 Other neutropenia: Secondary | ICD-10-CM | POA: Diagnosis not present

## 2024-01-29 DIAGNOSIS — M329 Systemic lupus erythematosus, unspecified: Secondary | ICD-10-CM | POA: Diagnosis not present

## 2024-01-29 DIAGNOSIS — R600 Localized edema: Secondary | ICD-10-CM | POA: Diagnosis not present

## 2024-01-29 DIAGNOSIS — M3501 Sicca syndrome with keratoconjunctivitis: Secondary | ICD-10-CM | POA: Diagnosis not present

## 2024-01-29 DIAGNOSIS — E669 Obesity, unspecified: Secondary | ICD-10-CM | POA: Diagnosis not present

## 2024-01-29 DIAGNOSIS — M1991 Primary osteoarthritis, unspecified site: Secondary | ICD-10-CM | POA: Diagnosis not present

## 2024-02-05 ENCOUNTER — Encounter (INDEPENDENT_AMBULATORY_CARE_PROVIDER_SITE_OTHER): Payer: Self-pay | Admitting: Otolaryngology

## 2024-02-05 ENCOUNTER — Ambulatory Visit (INDEPENDENT_AMBULATORY_CARE_PROVIDER_SITE_OTHER): Admitting: Otolaryngology

## 2024-02-05 VITALS — BP 130/84 | HR 96

## 2024-02-05 DIAGNOSIS — K130 Diseases of lips: Secondary | ICD-10-CM

## 2024-02-05 DIAGNOSIS — D3701 Neoplasm of uncertain behavior of lip: Secondary | ICD-10-CM

## 2024-02-05 NOTE — Progress Notes (Signed)
 ENT Progress Note:   Update 02/05/2024  Discussed the use of AI scribe software for clinical note transcription with the patient, who gave verbal consent to proceed.  History of Present Illness [name] is a 73 year old female with lupus who presents with a fluctuating lesion in the soft tissue above her left upper lip. She had face MRI.   She has a lesion that changes in size, sometimes becoming larger and then smaller, with some days being more visible than others. It may be slightly larger each time it becomes visible. No associated symptoms with the lesion.  The lesion was evaluated with an MRI, which showed a round and smooth lesion. The patient recalls being told it could possibly be a granuloma or neuroma. She is uncertain about the exact time course but estimates the lesion has grown over the past year.  She has a history of lupus and mentions having nodules in the past, which she associates with her condition. She recalls a previous surgery for a nodule on the inside of her jaw, which she had forgotten about until recently.   Records Reviewed:  Initial Evaluation  Reason for Consult: left upper lip lesion possible cyst   HPI: Discussed the use of AI scribe software for clinical note transcription with the patient, who gave verbal consent to proceed.  History of Present Illness [name] is a 73 year old female with hx of lupus who presents with upper lip lesion/mass. She was referred by her dermatologist for evaluation of the facial mass since there are not overlying skin changes and it appears to be confined to the underlying deep soft tissue planes.   She first noticed it approximately one year ago, describes it as a 'knot' that occasionally enlarges and sometimes exhibits a blue tinge. The swelling is intermittently palpable and can be painful upon pressure. It fluctuates in size, and at its largest, it feels as though it has grown slightly.  She consulted her  dermatologist, who was unable to determine the cause and recommended an ENT evaluation. Her dentist also examined the area and found no issues related to her oral health.   No numbness or tingling is associated with the swelling. No facial muscle weakness    Past Medical History:  Diagnosis Date   Anxiety    Atypical mole 11/19/2012   Mid Right Back (moderate to severe) (wider shave)   Basal cell carcinoma    Basal cell carcinoma 01/25/2021   sup-left lower leg (CX35FU)   BCC (basal cell carcinoma of skin) 09/17/2013   Left Inner Lower Leg (Ulcerated) (curet and excision)   Cerebral aneurysm without rupture 09/11/2018   2.5 mm supraclinoid left carotid artery   Complication of anesthesia    difficult to arouse   Diabetes (HCC)    Hypertension    IBS (irritable bowel syndrome)    Lupus    Migraine headache 09/11/2018   Neuropathy    PONV (postoperative nausea and vomiting)    Rheumatoid arthritis (HCC)    SCC (squamous cell carcinoma) 01/25/2021   in situ- right lower inner leg(CX35FU)   Sleep apnea    Squamous cell carcinoma of skin 10/27/2015   Left Inner Calf (in situ) (curet, 5FU and cautery)   Superficial basal cell carcinoma (BCC) 10/14/2008   Left Inner Lower Leg ([place], LN2)   Superficial basal cell carcinoma (BCC) 10/14/2008   Left Post Inf Calf ([place], LN2)   Superficial basal cell carcinoma (BCC) 10/14/2008  Left Post Sup Calf ([place], LN2)   Superficial basal cell carcinoma (BCC) 09/17/2013   Left Post Inf Calf (curet and 5FU)   Tremor 02/08/2021    Past Surgical History:  Procedure Laterality Date   BIOPSY N/A 02/16/2022   Procedure: RANDOM BIOPSYS;  Surgeon: Eartha Angelia Sieving, MD;  Location: [facility];  Service: Gastroenterology;  Laterality: N/A;   BIOPSY BREAST Right    negative   BLADDER SUSPENSION     BLADDER SUSPENSION     COLONOSCOPY WITH PROPOFOL  N/A 02/16/2022   Procedure: COLONOSCOPY WITH PROPOFOL ;  Surgeon: Eartha Angelia Sieving, MD;  Location: [facility];  Service: Gastroenterology;  Laterality: N/A;  1015 ASA 3   ESOPHAGOGASTRODUODENOSCOPY (EGD) WITH PROPOFOL   02/16/2022   Procedure: ESOPHAGOGASTRODUODENOSCOPY (EGD) WITH PROPOFOL ;  Surgeon: Eartha Angelia Sieving, MD;  Location: [facility];  Service: Gastroenterology;;   GALLBLADDER SURGERY     SAVORY DILATION  02/16/2022   Procedure: SAVORY DILATION;  Surgeon: Eartha Angelia Sieving, MD;  Location: [facility];  Service: Gastroenterology;;   TENDON EXPLORATION     arms/hand    Family History  Problem Relation Age of Onset   Cerebral aneurysm Mother    High blood pressure Mother    Thyroid  disease Mother    COPD Father     Social History:  reports that she has quit smoking. She has been exposed to tobacco smoke. She has never used smokeless tobacco. She reports that she does not drink alcohol  and does not use drugs.  Allergies:  Allergies  Allergen Reactions   Arava [Leflunomide] Other (See Comments)    hairloss   Clindamycin/Lincomycin Hives   Mestinon  [Pyridostigmine ]     GI issues    Methotrexate Other (See Comments)     leucopenia, hairloss   Sulfa Antibiotics Nausea And Vomiting    Medications: I have reviewed the patient's current medications.  The PMH, PSH, Medications, Allergies, and SH were reviewed and updated.  ROS: Constitutional: Negative for fever, weight loss and weight gain. Cardiovascular: Negative for chest pain and dyspnea on exertion. Respiratory: Is not experiencing shortness of breath at rest. Gastrointestinal: Negative for nausea and vomiting. Neurological: Negative for headaches. Psychiatric: The patient is not nervous/anxious  Blood pressure 130/84, pulse 96, SpO2 93%. There is no height or weight on file to calculate BMI.  PHYSICAL EXAM:  Exam: General: Well-developed, well-nourished Respiratory Respiratory effort: Equal inspiration and expiration without stridor Cardiovascular Peripheral  Vascular: Warm extremities with equal color/perfusion Eyes: No nystagmus with equal extraocular motion bilaterally Neuro/Psych/Balance: Patient oriented to person, place, and time; Appropriate mood and affect; Gait is intact with no imbalance; Cranial nerves I-XII are intact Head and Face Inspection: Normocephalic and atraumatic without mass or lesion Palpation: Facial skeleton intact without bony stepoffs Salivary Glands: No mass or tenderness Facial Strength: Facial motility symmetric and full bilaterally ENT Pinna: External ear intact and fully developed External canal: Canal is patent with intact skin Tympanic Membrane: Clear and mobile External Nose: No scar or anatomic deformity Internal Nose: Septum is relatively straight on anterior rhinoscopy  Lips, Teeth, and gums: Previously seen upper lip with a cystic lesion within the deep soft tissues with visible protuberance over it on the skin surface, bluish hue of overlying skin but no surface lesions - with bimanual palpation the lesion is soft and mobile ~ 1.5 to 2 cm in size Mucosa and teeth intact and viable TMJ: No pain to palpation with full mobility Oral cavity/oropharynx: No erythema or exudate,  no lesions present Neck Neck and Trachea: Midline trachea without mass or lesion Thyroid : No mass or nodularity Lymphatics: No lymphadenopathy  Studies Reviewed: MRA head 01/11/20  EXAM: MR angiogram of the intracranial arteries   ORDERING CLINICIAN: Carlin MARLA Bull, MD CLINICAL HISTORY: 73 year old woman with an intracranial aneurysm COMPARISON FILMS: MRI 02/04/2017   TECHNIQUE: MR angiogram of the head was obtained utilizing 3D time of flight sequences from below the vertebrobasilar junction up to the intracranial vasculature without contrast.  Computerized reconstructions were obtained. CONTRAST: None IMAGING SITE: [facility], [address] [place], [place], [place]   FINDINGS: Source and reconstructed images were reviewed.   The imaged extracranial segements of the internal carotid arteries appear normal.  As also seen on the 02/04/2017 MR angiogram, there is a 2.5 mm outpouching of the left supraclinoid internal carotid artery. This could represent either a small aneurysm or an enlarged arterial infundibulum.  It is unchanged in size and appearance.     The middle cerebral and anterior cerebral arteries appear normal.    In the posterior circulation, the vertebral arteries are co-dominant.  No significant stenosis in the vertebral arteries.. The posterior cerebral arteries appear normal.     IMPRESSION:  This MR angiogram of the intracranial arteries shows a small 2.5 mm outpouching of the left supraclinoid internal carotid artery. It is unchanged compared to the 02/04/2017 MRA.  This could represent a small aneurysm or an enlarged arterial infundibulum.  Esophagram 12/24/19 FINDINGS: No mass or stricture is noted in the esophagus. No hiatal hernia or reflux is noted. Barium tablet passed through esophagus and into stomach without difficulty or delay.   IMPRESSION: No abnormality seen in the esophagus.  Max/face CT 12/12/2007 Clinical Data: Mass felt by the patient inside the left cheek.    CT MAXILLOFACIAL WITH CONTRAST    Technique:  Multidetector CT imaging of the maxillofacial  structures was performed with intravenous contrast. Multiplanar CT  image reconstructions were also generated.    Contrast: 75 ml Omnipaque-300    Comparison: None.    Findings: There are marked streak artifacts from extensive metallic  dental work obscuring the oropharynx and lower facial structures.  No masses or enlarged lymph nodes are seen elsewhere.  Unremarkable  bones.    IMPRESSION:  Marked streak artifact from extensive metallic dental work  obscuring the area of interest.  No abnormality seen elsewhere.   MRI face 12/11/23 IMPRESSION: 9 x 3 x 4 mm well-circumscribed ovoid lesion of the left upper lip extending  superficially to the skin. The signal characteristics show low T1 signal and peripheral bright T2 signal and enhancement. This is consistent with a small mass or reactive inflammatory granuloma. Any sort of mesenchymal tumor could have this appearance including a neuroma. The findings of this lesion do not appear overtly aggressive.  Assessment/Plan: Encounter Diagnoses  Name Primary?   Mass of lip Yes   Neoplasm of uncertain behavior of upper lip, inner aspect      Assessment and Plan Assessment & Plan Facial mass  Left upper lip mass (deep soft tissues) likely a cyst, likely benign. Imaging required for further characterization. Discussed removal options and potential scarring if removal is desired and required based on imaging results. No facial numbness no weakness, no significant growth over time, all of which are reassuring features.  - Ordered MRI of facial region with contrast  - Coordinate insurance pre-authorization for MRI. - Provided [facility] contact for scheduling. - RTC after imaging   Update 02/05/24  Superficial soft tissue lesion (possible granuloma or neuroma based on MRI results) Lesion is superficial, round, smooth, with benign imaging features. Differential includes granuloma or neuroma vs other neoplasm. We discussed observation vs removal, and she prefers to watch it for 6 months.  - Monitor lesion for six months. - Schedule follow-up in six months for reassessment. - Advised to return sooner if lesion causes pain, issues, or significant growth.  Thank you for allowing me to participate in the care of this patient. Please do not hesitate to contact me with any questions or concerns.   Elena Larry, MD Otolaryngology [facility] Phone: [phone] Fax: [phone]    02/05/2024, 11:11 AM

## 2024-02-12 DIAGNOSIS — Z111 Encounter for screening for respiratory tuberculosis: Secondary | ICD-10-CM | POA: Diagnosis not present

## 2024-02-12 DIAGNOSIS — M0579 Rheumatoid arthritis with rheumatoid factor of multiple sites without organ or systems involvement: Secondary | ICD-10-CM | POA: Diagnosis not present

## 2024-02-12 DIAGNOSIS — R5383 Other fatigue: Secondary | ICD-10-CM | POA: Diagnosis not present

## 2024-02-12 DIAGNOSIS — Z79899 Other long term (current) drug therapy: Secondary | ICD-10-CM | POA: Diagnosis not present

## 2024-03-11 DIAGNOSIS — M0579 Rheumatoid arthritis with rheumatoid factor of multiple sites without organ or systems involvement: Secondary | ICD-10-CM | POA: Diagnosis not present

## 2024-04-08 DIAGNOSIS — M0579 Rheumatoid arthritis with rheumatoid factor of multiple sites without organ or systems involvement: Secondary | ICD-10-CM | POA: Diagnosis not present

## 2024-04-29 DIAGNOSIS — Z79899 Other long term (current) drug therapy: Secondary | ICD-10-CM | POA: Diagnosis not present

## 2024-04-29 DIAGNOSIS — M0579 Rheumatoid arthritis with rheumatoid factor of multiple sites without organ or systems involvement: Secondary | ICD-10-CM | POA: Diagnosis not present

## 2024-04-29 DIAGNOSIS — M329 Systemic lupus erythematosus, unspecified: Secondary | ICD-10-CM | POA: Diagnosis not present

## 2024-04-29 DIAGNOSIS — M3501 Sicca syndrome with keratoconjunctivitis: Secondary | ICD-10-CM | POA: Diagnosis not present

## 2024-04-29 DIAGNOSIS — D649 Anemia, unspecified: Secondary | ICD-10-CM | POA: Diagnosis not present

## 2024-04-29 DIAGNOSIS — Z6831 Body mass index (BMI) 31.0-31.9, adult: Secondary | ICD-10-CM | POA: Diagnosis not present

## 2024-04-29 DIAGNOSIS — M1991 Primary osteoarthritis, unspecified site: Secondary | ICD-10-CM | POA: Diagnosis not present

## 2024-04-29 DIAGNOSIS — E669 Obesity, unspecified: Secondary | ICD-10-CM | POA: Diagnosis not present

## 2024-04-29 DIAGNOSIS — L659 Nonscarring hair loss, unspecified: Secondary | ICD-10-CM | POA: Diagnosis not present

## 2024-05-02 DIAGNOSIS — D72819 Decreased white blood cell count, unspecified: Secondary | ICD-10-CM | POA: Diagnosis not present

## 2024-05-02 DIAGNOSIS — E7849 Other hyperlipidemia: Secondary | ICD-10-CM | POA: Diagnosis not present

## 2024-05-02 DIAGNOSIS — E1169 Type 2 diabetes mellitus with other specified complication: Secondary | ICD-10-CM | POA: Diagnosis not present

## 2024-05-02 DIAGNOSIS — R5383 Other fatigue: Secondary | ICD-10-CM | POA: Diagnosis not present

## 2024-05-06 DIAGNOSIS — M0579 Rheumatoid arthritis with rheumatoid factor of multiple sites without organ or systems involvement: Secondary | ICD-10-CM | POA: Diagnosis not present

## 2024-05-06 DIAGNOSIS — Z79899 Other long term (current) drug therapy: Secondary | ICD-10-CM | POA: Diagnosis not present

## 2024-05-06 DIAGNOSIS — D649 Anemia, unspecified: Secondary | ICD-10-CM | POA: Diagnosis not present

## 2024-05-09 DIAGNOSIS — M25572 Pain in left ankle and joints of left foot: Secondary | ICD-10-CM | POA: Diagnosis not present

## 2024-05-09 DIAGNOSIS — R5383 Other fatigue: Secondary | ICD-10-CM | POA: Diagnosis not present

## 2024-05-09 DIAGNOSIS — N1831 Chronic kidney disease, stage 3a: Secondary | ICD-10-CM | POA: Diagnosis not present

## 2024-05-09 DIAGNOSIS — E1169 Type 2 diabetes mellitus with other specified complication: Secondary | ICD-10-CM | POA: Diagnosis not present

## 2024-05-09 DIAGNOSIS — M069 Rheumatoid arthritis, unspecified: Secondary | ICD-10-CM | POA: Diagnosis not present

## 2024-05-09 DIAGNOSIS — K580 Irritable bowel syndrome with diarrhea: Secondary | ICD-10-CM | POA: Diagnosis not present

## 2024-05-09 DIAGNOSIS — I10 Essential (primary) hypertension: Secondary | ICD-10-CM | POA: Diagnosis not present

## 2024-05-09 DIAGNOSIS — Z23 Encounter for immunization: Secondary | ICD-10-CM | POA: Diagnosis not present

## 2024-05-09 DIAGNOSIS — M25571 Pain in right ankle and joints of right foot: Secondary | ICD-10-CM | POA: Diagnosis not present

## 2024-05-09 DIAGNOSIS — R600 Localized edema: Secondary | ICD-10-CM | POA: Diagnosis not present

## 2024-05-09 DIAGNOSIS — M3210 Systemic lupus erythematosus, organ or system involvement unspecified: Secondary | ICD-10-CM | POA: Diagnosis not present

## 2024-05-09 DIAGNOSIS — Z6830 Body mass index (BMI) 30.0-30.9, adult: Secondary | ICD-10-CM | POA: Diagnosis not present

## 2024-08-05 ENCOUNTER — Institutional Professional Consult (permissible substitution) (INDEPENDENT_AMBULATORY_CARE_PROVIDER_SITE_OTHER)

## 2024-12-22 ENCOUNTER — Encounter (INDEPENDENT_AMBULATORY_CARE_PROVIDER_SITE_OTHER): Admitting: Ophthalmology
